# Patient Record
Sex: Female | Born: 1990 | Race: Black or African American | Hispanic: No | Marital: Single | Smoking: Current some day smoker
Health system: Southern US, Community
[De-identification: ages and names within clinical notes are randomized; demographics above are authoritative.]

## PROBLEM LIST (undated history)

## (undated) DIAGNOSIS — A749 Chlamydial infection, unspecified: Secondary | ICD-10-CM

## (undated) DIAGNOSIS — O139 Gestational [pregnancy-induced] hypertension without significant proteinuria, unspecified trimester: Secondary | ICD-10-CM

## (undated) DIAGNOSIS — B379 Candidiasis, unspecified: Secondary | ICD-10-CM

## (undated) DIAGNOSIS — O24419 Gestational diabetes mellitus in pregnancy, unspecified control: Secondary | ICD-10-CM

## (undated) DIAGNOSIS — R87629 Unspecified abnormal cytological findings in specimens from vagina: Secondary | ICD-10-CM

## (undated) HISTORY — PX: WISDOM TOOTH EXTRACTION: SHX21

## (undated) HISTORY — DX: Gestational (pregnancy-induced) hypertension without significant proteinuria, unspecified trimester: O13.9

## (undated) HISTORY — PX: PILONIDAL CYST EXCISION: SHX744

## (undated) HISTORY — DX: Unspecified abnormal cytological findings in specimens from vagina: R87.629

---
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
## 2010-03-08 ENCOUNTER — Emergency Department (HOSPITAL_COMMUNITY): Admission: EM | Admit: 2010-03-08 | Discharge: 2009-12-28 | Payer: Self-pay | Admitting: Emergency Medicine

## 2011-04-19 ENCOUNTER — Ambulatory Visit (INDEPENDENT_AMBULATORY_CARE_PROVIDER_SITE_OTHER)

## 2011-04-19 DIAGNOSIS — R52 Pain, unspecified: Secondary | ICD-10-CM

## 2011-04-19 DIAGNOSIS — L03317 Cellulitis of buttock: Secondary | ICD-10-CM

## 2011-04-21 ENCOUNTER — Ambulatory Visit (INDEPENDENT_AMBULATORY_CARE_PROVIDER_SITE_OTHER)

## 2011-04-21 DIAGNOSIS — N920 Excessive and frequent menstruation with regular cycle: Secondary | ICD-10-CM

## 2011-04-21 DIAGNOSIS — Z309 Encounter for contraceptive management, unspecified: Secondary | ICD-10-CM

## 2011-05-14 ENCOUNTER — Ambulatory Visit

## 2013-01-27 ENCOUNTER — Emergency Department (HOSPITAL_COMMUNITY)
Admission: EM | Admit: 2013-01-27 | Discharge: 2013-01-27 | Disposition: A | Discharge status: Home / Self Care | Attending: Emergency Medicine | Admitting: Emergency Medicine

## 2013-01-27 ENCOUNTER — Encounter (HOSPITAL_COMMUNITY): Payer: Self-pay | Admitting: Emergency Medicine

## 2013-01-27 DIAGNOSIS — F17200 Nicotine dependence, unspecified, uncomplicated: Secondary | ICD-10-CM | POA: Insufficient documentation

## 2013-01-27 DIAGNOSIS — J3489 Other specified disorders of nose and nasal sinuses: Secondary | ICD-10-CM | POA: Insufficient documentation

## 2013-01-27 DIAGNOSIS — J069 Acute upper respiratory infection, unspecified: Secondary | ICD-10-CM | POA: Insufficient documentation

## 2013-01-27 DIAGNOSIS — R0982 Postnasal drip: Secondary | ICD-10-CM | POA: Insufficient documentation

## 2013-01-27 DIAGNOSIS — B9789 Other viral agents as the cause of diseases classified elsewhere: Secondary | ICD-10-CM

## 2013-01-27 DIAGNOSIS — R630 Anorexia: Secondary | ICD-10-CM | POA: Insufficient documentation

## 2013-01-27 NOTE — ED Provider Notes (Signed)
Medical screening examination/treatment/procedure(s) were performed by non-physician practitioner and as supervising physician I was immediately available for consultation/collaboration.    Celene Kras, MD 01/27/13 [phone]

## 2013-01-27 NOTE — ED Notes (Addendum)
Pt c/o "having a cold," cough, nasal congestions and "a cloudy head" x 4 days. Denies pain.  Pt sts all symptoms have resolved except "cloudy head."

## 2013-01-27 NOTE — ED Provider Notes (Signed)
CSN: [id number]     Arrival date & time 01/27/13  1009 History   First MD Initiated Contact with Patient 01/27/13 1016     No chief complaint on file.  (Consider location/radiation/quality/duration/timing/severity/associated sxs/prior Treatment) HPI Patient presents with 5 days of cold symptoms.  Reports nasal congestion that has cleared, dry cough, post-nasal drip, and "fuzzy headedness."  Has been taking robitussin and Nyquil with some relief. Her course is overall improving. Denies headache, fever, chills, SOB, CP, difficulty swallowing.  Has been drinking plenty of fluids and resting but not eating much due to lack of appetite.  Denies abdominal pain, N/V/D.  Possible sick contact at recent football game who spit on her neck.   No past medical history on file. No past surgical history on file. No family history on file. History  Substance Use Topics  . Smoking status: Not on file  . Smokeless tobacco: Not on file  . Alcohol Use: Not on file   OB History   No data available     Review of Systems  Constitutional: Negative for fever and chills.  HENT: Positive for rhinorrhea. Negative for congestion, sore throat and trouble swallowing.   Respiratory: Positive for cough. Negative for shortness of breath.   Cardiovascular: Negative for chest pain.  Gastrointestinal: Negative for nausea, vomiting, abdominal pain and diarrhea.  Genitourinary: Negative for dysuria, urgency, frequency, vaginal bleeding and vaginal discharge.    Allergies  Review of patient's allergies indicates not on file.  Home Medications   Current Outpatient Rx  Name  Route  Sig  Dispense  Refill  . DM-Doxylamine-Acetaminophen (NYQUIL COLD & FLU PO)   Oral   Take 40 mLs by mouth daily.         Marland Kitchen guaifenesin (ROBITUSSIN) 100 MG/5ML syrup   Oral   Take 200 mg by mouth 3 (three) times daily as needed for cough.          BP 123/68  Pulse 74  Temp(Src) 99 F (37.2 C) (Oral)  Resp 20  SpO2  100% Physical Exam  Nursing note and vitals reviewed. Constitutional: She appears well-developed and well-nourished. No distress.  HENT:  Head: Atraumatic.  Nose: Right sinus exhibits no maxillary sinus tenderness and no frontal sinus tenderness. Left sinus exhibits no maxillary sinus tenderness and no frontal sinus tenderness.  Mouth/Throat: Oropharynx is clear and moist. No oropharyngeal exudate.  Eyes: Conjunctivae are normal.  Neck: Trachea normal, normal range of motion, full passive range of motion without pain and phonation normal. Neck supple.  Cardiovascular: Normal rate and regular rhythm.   Pulmonary/Chest: Effort normal and breath sounds normal. No stridor. No respiratory distress. She has no wheezes. She has no rales.  Abdominal: Soft. She exhibits no distension. There is no tenderness. There is no rebound and no guarding.  Lymphadenopathy:    She has no cervical adenopathy.  Neurological: She is alert.  Skin: She is not diaphoretic.    ED Course  Procedures (including critical care time) Labs Review Labs Reviewed - No data to display Imaging Review No results found.  EKG Interpretation   None       MDM   1. Viral respiratory illness     Patient with constellation of respiratory symptoms over 5 days that is overall improving, taking OTC medications.  Afebrile, nontoxic, no meningeal signs.  Exam unremarkable.  Likely viral etiology. Pt not eating much, likely contributing to the "fuzzy headedness"  Discussed findings, treatment, and follow up  with patient.  Pt  given return precautions.  Pt verbalizes understanding and agrees with plan.       Trixie Dredge, PA-C 01/27/13 1103

## 2013-05-20 ENCOUNTER — Encounter (HOSPITAL_COMMUNITY): Payer: Self-pay | Admitting: Emergency Medicine

## 2013-05-20 ENCOUNTER — Emergency Department (HOSPITAL_COMMUNITY)
Admission: EM | Admit: 2013-05-20 | Discharge: 2013-05-20 | Disposition: A | Discharge status: Home / Self Care | Attending: Emergency Medicine | Admitting: Emergency Medicine

## 2013-05-20 DIAGNOSIS — L03317 Cellulitis of buttock: Principal | ICD-10-CM

## 2013-05-20 DIAGNOSIS — F17200 Nicotine dependence, unspecified, uncomplicated: Secondary | ICD-10-CM | POA: Insufficient documentation

## 2013-05-20 DIAGNOSIS — L0231 Cutaneous abscess of buttock: Secondary | ICD-10-CM | POA: Insufficient documentation

## 2013-05-20 MED ORDER — AMOXICILLIN-POT CLAVULANATE 875-125 MG PO TABS: 1.0000 | ORAL_TABLET | Freq: Two times a day (BID) | ORAL | Status: DC

## 2013-05-20 MED ORDER — HYDROCODONE-ACETAMINOPHEN 5-325 MG PO TABS: 1.0000 | ORAL_TABLET | Freq: Four times a day (QID) | ORAL | Status: DC | PRN

## 2013-05-20 NOTE — ED Provider Notes (Signed)
CSN: [id number]     Arrival date & time 05/20/13  1632 History   First MD Initiated Contact with Patient 05/20/13 1653     Chief Complaint  Patient presents with  . Abscess     (Consider location/radiation/quality/duration/timing/severity/associated sxs/prior Treatment) Patient is a 23 y.o. female presenting with abscess. The history is provided by the patient.  Abscess Location:  Ano-genital Ano-genital abscess location:  L buttock Size:  2 cm Abscess quality: painful and warmth   Abscess quality: not draining, no fluctuance, no induration, no itching, no redness and not weeping   Red streaking: no   Progression:  Worsening Pain details:    Quality:  Sharp   Severity:  Moderate   Timing:  Constant Chronicity:  Recurrent Context: not diabetes and not immunosuppression   Relieved by:  Nothing Worsened by:  Nothing tried Associated symptoms: no fever and no vomiting     History reviewed. No pertinent past medical history. Past Surgical History  Procedure Laterality Date  . Pilonidal cyst excision     No family history on file. History  Substance Use Topics  . Smoking status: Current Every Day Smoker -- 0.25 packs/day    Types: Cigarettes  . Smokeless tobacco: Never Used  . Alcohol Use: Yes   OB History   Grav Para Term Preterm Abortions TAB SAB Ect Mult Living                 Review of Systems  Constitutional: Negative for fever.  Respiratory: Negative for cough and shortness of breath.   Cardiovascular: Negative for chest pain and leg swelling.  Gastrointestinal: Negative for vomiting and abdominal pain.  All other systems reviewed and are negative.      Allergies  Review of patient's allergies indicates no known allergies.  Home Medications   Current Outpatient Rx  Name  Route  Sig  Dispense  Refill  . acetaminophen (TYLENOL) 500 MG tablet   Oral   Take 1,000 mg by mouth every 6 (six) hours as needed for moderate pain.         Marland Kitchen. ibuprofen  (ADVIL,MOTRIN) 200 MG tablet   Oral   Take 200 mg by mouth every 6 (six) hours as needed for moderate pain.          BP 123/77  Pulse 83  Temp(Src) 98.7 F (37.1 C) (Oral)  Resp 20  SpO2 100%  LMP 05/08/2013 Physical Exam  Nursing note and vitals reviewed. Constitutional: She is oriented to person, place, and time. She appears well-developed and well-nourished. No distress.  HENT:  Head: Normocephalic and atraumatic.  Eyes: EOM are normal. Pupils are equal, round, and reactive to light.  Neck: Normal range of motion. Neck supple.  Cardiovascular: Normal rate and regular rhythm.  Exam reveals no friction rub.   No murmur heard. Pulmonary/Chest: Effort normal and breath sounds normal. No respiratory distress. She has no wheezes. She has no rales.  Abdominal: Soft. She exhibits no distension. There is no tenderness. There is no rebound.  Musculoskeletal: Normal range of motion. She exhibits no edema.  Neurological: She is alert and oriented to person, place, and time.  Skin: No abrasion, no burn and no rash noted. She is not diaphoretic.       ED Course  INCISION AND DRAINAGE Date/Time: 05/20/2013 6:00 PM Performed by: Dagmar HaitWALDEN, WILLIAM Deliliah Spranger Authorized by: Dagmar HaitWALDEN, WILLIAM Mearle Drew Consent: Verbal consent obtained. Type: abscess Body area: lower extremity Location details: left buttock Anesthesia: local infiltration Local anesthetic: lidocaine  2% with epinephrine Anesthetic total: 6 ml Patient sedated: no Risk factor: underlying major vessel Scalpel size: 11 Incision type: single straight Complexity: simple Drainage: purulent Drainage amount: copious Wound treatment: drain placed Packing material: 1/2 in iodoform gauze Patient tolerance: Patient tolerated the procedure well with no immediate complications.   (including critical care time) Labs Review Labs Reviewed - No data to display Imaging Review No results found.  EKG Interpretation   None       MDM    Final diagnoses:  Left buttock abscess    87F here with buttock abscess. Hx of pilonidal cyst excision. No pilonidal today, small abscess left of midline on superior left buttock. No surrounding cellulitis. I&D as above. Surgery referral given.  Will place on antibiotics due to large amount of pus expressed and much induration once examined with some local anesthesia. Stable for discharge.  Dagmar Hait, MD 05/20/13 [phone]

## 2013-05-20 NOTE — Discharge Instructions (Signed)
Abscess An abscess is an infected area that contains a collection of pus and debris.It can occur in almost any part of the body. An abscess is also known as a furuncle or boil. CAUSES  An abscess occurs when tissue gets infected. This can occur from blockage of oil or sweat glands, infection of hair follicles, or a minor injury to the skin. As the body tries to fight the infection, pus collects in the area and creates pressure under the skin. This pressure causes pain. People with weakened immune systems have difficulty fighting infections and get certain abscesses more often.  SYMPTOMS Usually an abscess develops on the skin and becomes a painful mass that is red, warm, and tender. If the abscess forms under the skin, you may feel a moveable soft area under the skin. Some abscesses break open (rupture) on their own, but most will continue to get worse without care. The infection can spread deeper into the body and eventually into the bloodstream, causing you to feel ill.  DIAGNOSIS  Your caregiver will take your medical history and perform a physical exam. A sample of fluid may also be taken from the abscess to determine what is causing your infection. TREATMENT  Your caregiver may prescribe antibiotic medicines to fight the infection. However, taking antibiotics alone usually does not cure an abscess. Your caregiver may need to make a small cut (incision) in the abscess to drain the pus. In some cases, gauze is packed into the abscess to reduce pain and to continue draining the area. HOME CARE INSTRUCTIONS   Only take over-the-counter or prescription medicines for pain, discomfort, or fever as directed by your caregiver.  If you were prescribed antibiotics, take them as directed. Finish them even if you start to feel better.  If gauze is used, follow your caregiver's directions for changing the gauze.  To avoid spreading the infection:  Keep your draining abscess covered with a  bandage.  Wash your hands well.  Do not share personal care items, towels, or whirlpools with others.  Avoid skin contact with others.  Keep your skin and clothes clean around the abscess.  Keep all follow-up appointments as directed by your caregiver. SEEK MEDICAL CARE IF:   You have increased pain, swelling, redness, fluid drainage, or bleeding.  You have muscle aches, chills, or a general ill feeling.  You have a fever. MAKE SURE YOU:   Understand these instructions.  Will watch your condition.  Will get help right away if you are not doing well or get worse. Document Released: 12/26/2004 Document Revised: 09/17/2011 Document Reviewed: 05/31/2011 [employer] Patient Information 2014 [employer].  

## 2013-05-20 NOTE — ED Notes (Signed)
Pt c/o abscess on her tailbone that came up on Monday. Pt states that she has re-occurring abscess in that seam spot for several years now. Pt requesting referral to surgeon to get the core surgically removed to prevent them from coming back.

## 2013-05-22 ENCOUNTER — Encounter (HOSPITAL_COMMUNITY): Payer: Self-pay | Admitting: Emergency Medicine

## 2013-05-22 ENCOUNTER — Emergency Department (HOSPITAL_COMMUNITY)
Admission: EM | Admit: 2013-05-22 | Discharge: 2013-05-22 | Disposition: A | Discharge status: Home / Self Care | Attending: Emergency Medicine | Admitting: Emergency Medicine

## 2013-05-22 DIAGNOSIS — F17200 Nicotine dependence, unspecified, uncomplicated: Secondary | ICD-10-CM | POA: Insufficient documentation

## 2013-05-22 DIAGNOSIS — Z792 Long term (current) use of antibiotics: Secondary | ICD-10-CM | POA: Insufficient documentation

## 2013-05-22 DIAGNOSIS — L03317 Cellulitis of buttock: Principal | ICD-10-CM

## 2013-05-22 DIAGNOSIS — L0231 Cutaneous abscess of buttock: Secondary | ICD-10-CM | POA: Insufficient documentation

## 2013-05-22 MED ORDER — OXYCODONE-ACETAMINOPHEN 5-325 MG PO TABS: 1.0000 | ORAL_TABLET | Freq: Four times a day (QID) | ORAL | Status: DC | PRN

## 2013-05-22 NOTE — Discharge Instructions (Signed)
Return here as needed. °

## 2013-05-22 NOTE — ED Notes (Signed)
Per pt, had abscess drained on Thurs-here for check

## 2013-05-22 NOTE — ED Provider Notes (Signed)
CSN: [id number]     Arrival date & time 05/22/13  1544 History  This chart was scribed for non-physician practitioner, Ebbie Ridgehris Caleyah Jr, PA-C,working with Hilario Quarryanielle S Ray, MD, by Karle PlumberJennifer Tensley, ED Scribe.  This patient was seen in room WTR5/WTR5 and the patient's care was started at 4:34 PM.  Chief Complaint  Patient presents with  . Wound Check   The history is provided by the patient. No language interpreter was used.   HPI Comments:  Michelle Taylor is a 23 y.o. female who presents to the Emergency Department needing a wound check secondary to having an left buttock abscess drained here two days ago. Pt states she had some pain and drainage since. She denies fever.   History reviewed. No pertinent past medical history. Past Surgical History  Procedure Laterality Date  . Pilonidal cyst excision     No family history on file. History  Substance Use Topics  . Smoking status: Current Every Day Smoker -- 0.25 packs/day    Types: Cigarettes  . Smokeless tobacco: Never Used  . Alcohol Use: Yes   OB History   Grav Para Term Preterm Abortions TAB SAB Ect Mult Living                 Review of Systems A complete 10 system review of systems was obtained and all systems are negative except as noted in the HPI and PMH.   Allergies  Review of patient's allergies indicates no known allergies.  Home Medications   Current Outpatient Rx  Name  Route  Sig  Dispense  Refill  . amoxicillin-clavulanate (AUGMENTIN) 875-125 MG per tablet   Oral   Take 1 tablet by mouth 2 (two) times daily. One po bid x 7 days   14 tablet   0   . HYDROcodone-acetaminophen (NORCO) 5-325 MG per tablet   Oral   Take 1 tablet by mouth every 6 (six) hours as needed for moderate pain.   10 tablet   0    Triage Vitals: BP 114/66  Pulse 88  Temp(Src) 98.2 F (36.8 C) (Oral)  Resp 16  SpO2 100%  LMP 05/08/2013 Physical Exam  Nursing note and vitals reviewed. Constitutional: She is oriented to person,  place, and time. She appears well-developed and well-nourished.  HENT:  Head: Normocephalic and atraumatic.  Eyes: EOM are normal.  Neck: Normal range of motion.  Cardiovascular: Normal rate.   Pulmonary/Chest: Effort normal.  Musculoskeletal: Normal range of motion.  Neurological: She is alert and oriented to person, place, and time.  Skin: Skin is warm and dry.  Abscess to upper left buttock. Still draining pus.  Psychiatric: She has a normal mood and affect. Her behavior is normal.    ED Course  Procedures (including critical care time) DIAGNOSTIC STUDIES: Oxygen Saturation is 100% on RA, normal by my interpretation.   COORDINATION OF CARE: 4:36 PM- Will give pt medication for pain and redress wound. Pt verbalizes understanding and agrees to plan.    I personally performed the services described in this documentation, which was scribed in my presence. The recorded information has been reviewed and is accurate.    Carlyle DollyChristopher W Shadd Dunstan, PA-C 05/22/13 1650

## 2013-05-23 NOTE — ED Provider Notes (Signed)
History/physical exam/procedure(s) were performed by non-physician practitioner and as supervising physician I was immediately available for consultation/collaboration. I have reviewed all notes and am in agreement with care and plan.   Greggory Safranek S Markies Mowatt, MD 05/23/13 0020 

## 2013-05-27 ENCOUNTER — Telehealth (HOSPITAL_COMMUNITY): Payer: Self-pay | Admitting: Emergency Medicine

## 2013-05-27 NOTE — ED Notes (Signed)
Pharmacy called to verify Rx for Diflucan.

## 2013-06-21 ENCOUNTER — Ambulatory Visit (INDEPENDENT_AMBULATORY_CARE_PROVIDER_SITE_OTHER): Admitting: General Surgery

## 2013-06-21 ENCOUNTER — Encounter (INDEPENDENT_AMBULATORY_CARE_PROVIDER_SITE_OTHER): Payer: Self-pay | Admitting: General Surgery

## 2013-06-21 VITALS — BP 124/80 | HR 76 | Temp 98.2°F | Resp 16 | Ht 60.0 in | Wt 157.0 lb

## 2013-06-21 DIAGNOSIS — L0591 Pilonidal cyst without abscess: Secondary | ICD-10-CM

## 2013-06-21 NOTE — Progress Notes (Signed)
Patient ID: Michelle Taylor, female   DOB: 11-Apr-1990, 23 y.o.   MRN: [id number]  Chief Complaint  Patient presents with  . Cyst    HPI Michelle KannerBrittany Taylor is a 23 y.o. female.  The patient is a 23 year old female arrest today secondary to a pilonidal cyst that was incised and drained. She's been doing with this proximally for 8 years on a yearly basis.   The patient has been on antibiotics, Augmentin, since incision and drainage. Patient states the pain has gotten better since the initial incision and drainage. She's had no fevers. HPI  History reviewed. No pertinent past medical history.  Past Surgical History  Procedure Laterality Date  . Pilonidal cyst excision      History reviewed. No pertinent family history.  Social History History  Substance Use Topics  . Smoking status: Current Every Day Smoker -- 0.25 packs/day    Types: Cigarettes  . Smokeless tobacco: Never Used  . Alcohol Use: Yes    No Known Allergies  Current Outpatient Prescriptions  Medication Sig Dispense Refill  . HYDROcodone-acetaminophen (NORCO) 5-325 MG per tablet Take 1 tablet by mouth every 6 (six) hours as needed for moderate pain.  10 tablet  0  . oxyCODONE-acetaminophen (PERCOCET/ROXICET) 5-325 MG per tablet Take 1 tablet by mouth every 6 (six) hours as needed for severe pain.  15 tablet  0   No current facility-administered medications for this visit.    Review of Systems Review of Systems  Constitutional: Negative.   HENT: Negative.   Respiratory: Negative.   Cardiovascular: Negative.   Gastrointestinal: Negative.   Neurological: Negative.   All other systems reviewed and are negative.    Blood pressure 124/80, pulse 76, temperature 98.2 F (36.8 C), temperature source Oral, resp. rate 16, height 5' (1.524 m), weight 157 lb (71.215 kg).  Physical Exam Physical Exam  Constitutional: She is oriented to person, place, and time. She appears well-developed and well-nourished.  HENT:  Head:  Normocephalic and atraumatic.  Eyes: Conjunctivae and EOM are normal. Pupils are equal, round, and reactive to light.  Neck: Normal range of motion. Neck supple.  Cardiovascular: Normal rate, regular rhythm and normal heart sounds.   Pulmonary/Chest: Effort normal and breath sounds normal.  Abdominal: Soft. Bowel sounds are normal. She exhibits no distension and no mass. There is no tenderness. There is no rebound and no guarding.  Musculoskeletal: Normal range of motion.  Neurological: She is alert and oriented to person, place, and time.  Skin: Skin is warm and dry.     Psychiatric: She has a normal mood and affect.    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 We will proceed to the operating room for excision of a pilonidal cyst. 2. I discussed with the patient the risks and benefits of the procedure to include but not limited to infection, bleeding, damage to structures, possible wound healing complications, possible need for revision. Patient voiced understanding and wishes to proceed.        Marigene Ehlersamirez Jr., Michelle Taylor 06/21/2013, 4:06 PM

## 2017-07-11 DIAGNOSIS — N611 Abscess of the breast and nipple: Secondary | ICD-10-CM | POA: Diagnosis not present

## 2017-07-14 DIAGNOSIS — N611 Abscess of the breast and nipple: Secondary | ICD-10-CM | POA: Diagnosis not present

## 2017-07-15 DIAGNOSIS — N611 Abscess of the breast and nipple: Secondary | ICD-10-CM | POA: Diagnosis not present

## 2017-09-10 ENCOUNTER — Ambulatory Visit (INDEPENDENT_AMBULATORY_CARE_PROVIDER_SITE_OTHER): Payer: BLUE CROSS/BLUE SHIELD | Admitting: *Deleted

## 2017-09-10 DIAGNOSIS — N926 Irregular menstruation, unspecified: Secondary | ICD-10-CM

## 2017-09-10 DIAGNOSIS — Z3201 Encounter for pregnancy test, result positive: Secondary | ICD-10-CM | POA: Diagnosis not present

## 2017-09-10 LAB — POCT URINE PREGNANCY: Preg Test, Ur: POSITIVE — AB

## 2017-09-10 NOTE — Progress Notes (Signed)
I have reviewed the chart and agree with nursing staff's documentation of this patient's encounter.  Roe CoombsRachelle A Bexton Haak, CNM 09/10/2017 11:49 AM

## 2017-09-10 NOTE — Progress Notes (Signed)
[name] presents today for UPT. She has no unusual complaints. LMP: 08/04/17    OBJECTIVE: Appears well, in no apparent distress.  OB History    Gravida  1   Para      Term      Preterm      AB      Living        SAB      TAB      Ectopic      Multiple      Live Births             Home UPT Result: Positive  In-Office UPT result: Positive I have reviewed the patient's medical, obstetrical, social, and family histories, and medications.   ASSESSMENT: Positive pregnancy test  PLAN: Prenatal care to be completed at: [facility] PNV samples given today. 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  Pt advised to apply for Medicaid soon as it may take up to 30 days to approve.

## 2017-09-12 ENCOUNTER — Encounter: Payer: Self-pay | Admitting: *Deleted

## 2017-10-20 ENCOUNTER — Encounter: Payer: Self-pay | Admitting: Advanced Practice Midwife

## 2017-10-20 ENCOUNTER — Ambulatory Visit (INDEPENDENT_AMBULATORY_CARE_PROVIDER_SITE_OTHER): Payer: BLUE CROSS/BLUE SHIELD | Admitting: Advanced Practice Midwife

## 2017-10-20 VITALS — BP 123/78 | HR 89 | Wt 166.0 lb

## 2017-10-20 DIAGNOSIS — Z3481 Encounter for supervision of other normal pregnancy, first trimester: Secondary | ICD-10-CM | POA: Diagnosis not present

## 2017-10-20 DIAGNOSIS — Z3401 Encounter for supervision of normal first pregnancy, first trimester: Secondary | ICD-10-CM | POA: Diagnosis not present

## 2017-10-20 DIAGNOSIS — E559 Vitamin D deficiency, unspecified: Secondary | ICD-10-CM | POA: Diagnosis not present

## 2017-10-20 MED ORDER — VITAFOL ULTRA 29-0.6-0.4-200 MG PO CAPS: 1.0000 | ORAL_CAPSULE | Freq: Every day | ORAL | 11 refills | Status: DC

## 2017-10-20 NOTE — Progress Notes (Signed)
   PRENATAL VISIT NOTE  Subjective:  Michelle Taylor is a 27 y.o. G1P0 at 5112w0d being seen today for initial prenatal visit.  She is currently monitored for the following issues for this low-risk pregnancy and has Encounter for supervision of normal first pregnancy in first trimester on their problem list.  Patient reports no complaints.  Contractions: Not present. Vag. Bleeding: None.   . Denies leaking of fluid.   The following portions of the patient's history were reviewed and updated as appropriate: allergies, current medications, past family history, past medical history, past social history, past surgical history and problem list. Problem list updated.  Objective:   Vitals:   10/20/17 1014  BP: 123/78  Pulse: 89  Weight: 166 lb (75.3 kg)    Fetal Status: Fetal Heart Rate (bpm): 166         General:  Alert, oriented and cooperative. Patient is in no acute distress.  Skin: Skin is warm and dry. No rash noted.   Cardiovascular: Normal heart rate noted  Respiratory: Normal respiratory effort, no problems with respiration noted  Abdomen: Soft, gravid, appropriate for gestational age.  Pain/Pressure: Absent     Pelvic: Cervical exam deferred Pap 2 years ago, consider Pap postpartum.        Extremities: Normal range of motion.     Mental Status: Normal mood and affect. Normal behavior. Normal judgment and thought content.   Assessment and Plan:  Pregnancy: G1P0 at 5512w0d  1. Encounter for supervision of normal first pregnancy in first trimester --Anticipatory guidance about next visits/weeks of pregnancy given. - Cytology - PAP - Hemoglobinopathy evaluation - VITAMIN D 25 Hydroxy (Vit-D Deficiency, Fractures) - Culture, OB Urine - Obstetric Panel, Including HIV - Hemoglobin A1c --Discussed genetic screening as optional testing with pt.  Discussed risks of false positives and anxiety awaiting test results.  Discussed benefits of testing including early knowledge of  problems/anomalies and time for termination if desired. Discussed alternatives include diagnostic testing including amniocentesis and NIPS or choosing to decline genetic screening by blood work.  Pt aware that anatomy US is form of genetic screening with lower accuracy in detecting trisomies than blood work.  Pt chooses genetic screening today. - Genetic Screening  Preterm labor symptoms and general obstetric precautions including but not limited to vaginal bleeding, contractions, leaking of fluid and fetal movement were reviewed in detail with the patient. Please refer to After Visit Summary for other counseling recommendations.  Return in about 1 month (around 11/17/2017).  Future Appointments  Date Time Provider Department Center  11/17/2017  9:30 AM Calvert CantorWeinhold, Samantha C, CNM [facility] None    Sharen CounterLisa Leftwich-Kirby, [place]

## 2017-10-20 NOTE — Patient Instructions (Signed)

## 2017-10-23 LAB — OBSTETRIC PANEL, INCLUDING HIV
Antibody Screen: NEGATIVE
Basophils Absolute: 0 x10E3/uL (ref 0.0–0.2)
Basos: 0 %
EOS (ABSOLUTE): 0 x10E3/uL (ref 0.0–0.4)
Eos: 0 %
HIV Screen 4th Generation wRfx: NONREACTIVE
Hematocrit: 38.3 % (ref 34.0–46.6)
Hemoglobin: 12.3 g/dL (ref 11.1–15.9)
Hepatitis B Surface Ag: NEGATIVE
Immature Grans (Abs): 0 x10E3/uL (ref 0.0–0.1)
Immature Granulocytes: 0 %
Lymphocytes Absolute: 1.8 x10E3/uL (ref 0.7–3.1)
Lymphs: 30 %
MCH: 29.6 pg (ref 26.6–33.0)
MCHC: 32.1 g/dL (ref 31.5–35.7)
MCV: 92 fL (ref 79–97)
Monocytes Absolute: 0.5 x10E3/uL (ref 0.1–0.9)
Monocytes: 8 %
Neutrophils Absolute: 3.8 x10E3/uL (ref 1.4–7.0)
Neutrophils: 62 %
Platelets: 337 x10E3/uL (ref 150–450)
RBC: 4.16 x10E6/uL (ref 3.77–5.28)
RDW: 13.6 % (ref 12.3–15.4)
RPR Ser Ql: NONREACTIVE
Rh Factor: POSITIVE
Rubella Antibodies, IGG: 23.4 {index}
WBC: 6.1 x10E3/uL (ref 3.4–10.8)

## 2017-10-23 LAB — HEMOGLOBINOPATHY EVALUATION
HGB C: 0 %
HGB S: 0 %
HGB VARIANT: 0 %
Hemoglobin A2 Quantitation: 2.5 % (ref 1.8–3.2)
Hemoglobin F Quantitation: 0 % (ref 0.0–2.0)
Hgb A: 97.5 % (ref 96.4–98.8)

## 2017-10-23 LAB — CULTURE, OB URINE

## 2017-10-23 LAB — HEMOGLOBIN A1C
ESTIMATED AVERAGE GLUCOSE: 117 mg/dL
Hgb A1c MFr Bld: 5.7 % — ABNORMAL HIGH (ref 4.8–5.6)

## 2017-10-23 LAB — URINE CULTURE, OB REFLEX

## 2017-10-23 LAB — VITAMIN D 25 HYDROXY (VIT D DEFICIENCY, FRACTURES): Vit D, 25-Hydroxy: 28.9 ng/mL — ABNORMAL LOW (ref 30.0–100.0)

## 2017-10-27 ENCOUNTER — Telehealth: Payer: Self-pay

## 2017-10-27 NOTE — Telephone Encounter (Signed)
Patient called in to see if panorama results were in, and to discuss hip pain. Pt denies any pelvic pressure and spotting.

## 2017-11-17 ENCOUNTER — Encounter: Payer: Self-pay | Admitting: Advanced Practice Midwife

## 2017-11-17 ENCOUNTER — Ambulatory Visit (INDEPENDENT_AMBULATORY_CARE_PROVIDER_SITE_OTHER): Payer: Medicaid Other | Admitting: Advanced Practice Midwife

## 2017-11-17 VITALS — BP 122/74 | HR 95 | Wt 167.4 lb

## 2017-11-17 DIAGNOSIS — Z3402 Encounter for supervision of normal first pregnancy, second trimester: Secondary | ICD-10-CM

## 2017-11-17 DIAGNOSIS — R7303 Prediabetes: Secondary | ICD-10-CM | POA: Insufficient documentation

## 2017-11-17 DIAGNOSIS — N949 Unspecified condition associated with female genital organs and menstrual cycle: Secondary | ICD-10-CM

## 2017-11-17 DIAGNOSIS — Z3401 Encounter for supervision of normal first pregnancy, first trimester: Secondary | ICD-10-CM

## 2017-11-17 NOTE — Patient Instructions (Signed)
Second Trimester of Pregnancy The second trimester is from week 13 through week 28, month 4 through 6. This is often the time in pregnancy that you feel your best. Often times, morning sickness has lessened or quit. You may have more energy, and you may get hungry more often. Your unborn baby (fetus) is growing rapidly. At the end of the sixth month, he or she is about 9 inches long and weighs about 1 pounds. You will likely feel the baby move (quickening) between 18 and 20 weeks of pregnancy. Follow these instructions at home:  Avoid all smoking, herbs, and alcohol. Avoid drugs not approved by your doctor.  Do not use any tobacco products, including cigarettes, chewing tobacco, and electronic cigarettes. If you need help quitting, ask your doctor. You may get counseling or other support to help you quit.  Only take medicine as told by your doctor. Some medicines are safe and some are not during pregnancy.  Exercise only as told by your doctor. Stop exercising if you start having cramps.  Eat regular, healthy meals.  Wear a good support bra if your breasts are tender.  Do not use hot tubs, steam rooms, or saunas.  Wear your seat belt when driving.  Avoid raw meat, uncooked cheese, and liter boxes and soil used by cats.  Take your prenatal vitamins.  Take 1500-2000 milligrams of calcium daily starting at the 20th week of pregnancy until you deliver your baby.  Try taking medicine that helps you poop (stool softener) as needed, and if your doctor approves. Eat more fiber by eating fresh fruit, vegetables, and whole grains. Drink enough fluids to keep your pee (urine) clear or pale yellow.  Take warm water baths (sitz baths) to soothe pain or discomfort caused by hemorrhoids. Use hemorrhoid cream if your doctor approves.  If you have puffy, bulging veins (varicose veins), wear support hose. Raise (elevate) your feet for 15 minutes, 3-4 times a day. Limit salt in your diet.  Avoid heavy  lifting, wear low heals, and sit up straight.  Rest with your legs raised if you have leg cramps or low back pain.  Visit your dentist if you have not gone during your pregnancy. Use a soft toothbrush to brush your teeth. Be gentle when you floss.  You can have sex (intercourse) unless your doctor tells you not to.  Go to your doctor visits. Get help if:  You feel dizzy.  You have mild cramps or pressure in your lower belly (abdomen).  You have a nagging pain in your belly area.  You continue to feel sick to your stomach (nauseous), throw up (vomit), or have watery poop (diarrhea).  You have bad smelling fluid coming from your vagina.  You have pain with peeing (urination). Get help right away if:  You have a fever.  You are leaking fluid from your vagina.  You have spotting or bleeding from your vagina.  You have severe belly cramping or pain.  You lose or gain weight rapidly.  You have trouble catching your breath and have chest pain.  You notice sudden or extreme puffiness (swelling) of your face, hands, ankles, feet, or legs.  You have not felt the baby move in over an hour.  You have severe headaches that do not go away with medicine.  You have vision changes. This information is not intended to replace advice given to you by your health care provider. Make sure you discuss any questions you have with your health care   provider. Document Released: 06/12/2009 Document Revised: 08/24/2015 Document Reviewed: 05/19/2012 Elsevier Interactive Patient Education  2017 Elsevier Inc.  

## 2017-11-17 NOTE — Progress Notes (Signed)
   PRENATAL VISIT NOTE  Subjective:  Carmie KannerBrittany Grunert is a 27 y.o. G1P0 at 115w0d being seen today for ongoing prenatal care.  She is currently monitored for the following issues for this low-risk pregnancy and has Encounter for supervision of normal first pregnancy in first trimester and Prediabetes on their problem list.  Patient reports no bleeding, no contractions, no cramping and no leaking.  Contractions: Not present. Vag. Bleeding: None.  Movement: Present. Denies leaking of fluid.   The following portions of the patient's history were reviewed and updated as appropriate: allergies, current medications, past family history, past medical history, past social history, past surgical history and problem list. Problem list updated.  Objective:   Vitals:   11/17/17 0952  BP: 122/74  Pulse: 95  Weight: 167 lb 6.4 oz (75.9 kg)    Fetal Status: Fetal Heart Rate (bpm): 156   Movement: Present     General:  Alert, oriented and cooperative. Patient is in no acute distress.  Skin: Skin is warm and dry. No rash noted.   Cardiovascular: Normal heart rate noted  Respiratory: Normal respiratory effort, no problems with respiration noted  Abdomen: Soft, gravid, appropriate for gestational age.  Pain/Pressure: Present     Pelvic: Cervical exam deferred        Extremities: Normal range of motion.  Edema: None  Mental Status: Normal mood and affect. Normal behavior. Normal judgment and thought content.   Assessment and Plan:  Pregnancy: G1P0 at 8327w0d  1. Encounter for supervision of normal first pregnancy in second trimester - Round ligament pain, discussed sleeping with pillow between knees, floating in pool - Continue routine care - AFP, Serum, Open Spina Bifida - US MFM OB COMP + 14 WK; Future  2. Prediabetes - Discussed dietary focus on protein, fresh vegetables, focus on PO hydration with water   Preterm labor symptoms and general obstetric precautions including but not limited to  vaginal bleeding, contractions, leaking of fluid and fetal movement were reviewed in detail with the patient. Please refer to After Visit Summary for other counseling recommendations.  Return in about 5 weeks (around 12/22/2017).  No future appointments.  Calvert CantorSamantha C Weinhold, CNM

## 2017-11-17 NOTE — Progress Notes (Signed)
Pt presents for ROB c/o bilateral hip pain. AFP due today.

## 2017-11-19 LAB — AFP, SERUM, OPEN SPINA BIFIDA
AFP MoM: 0.58
AFP VALUE AFPOSL: 16.8 ng/mL
GEST. AGE ON COLLECTION DATE: 15 wk
MATERNAL AGE AT EDD: 27.3 a
OSBR RISK 1 IN: 10000
Test Results:: NEGATIVE
Weight: 168 [lb_av]

## 2017-12-15 ENCOUNTER — Encounter (HOSPITAL_COMMUNITY): Payer: Self-pay

## 2017-12-22 ENCOUNTER — Ambulatory Visit (HOSPITAL_COMMUNITY)
Admission: RE | Admit: 2017-12-22 | Discharge: 2017-12-22 | Disposition: A | Discharge status: Home / Self Care | Payer: Medicaid Other | Source: Ambulatory Visit | Attending: Advanced Practice Midwife | Admitting: Advanced Practice Midwife

## 2017-12-22 ENCOUNTER — Other Ambulatory Visit (HOSPITAL_COMMUNITY): Payer: Self-pay | Admitting: *Deleted

## 2017-12-22 ENCOUNTER — Ambulatory Visit (INDEPENDENT_AMBULATORY_CARE_PROVIDER_SITE_OTHER): Payer: Medicaid Other

## 2017-12-22 DIAGNOSIS — Z3402 Encounter for supervision of normal first pregnancy, second trimester: Secondary | ICD-10-CM | POA: Diagnosis present

## 2017-12-22 DIAGNOSIS — Z3A20 20 weeks gestation of pregnancy: Secondary | ICD-10-CM | POA: Diagnosis not present

## 2017-12-22 DIAGNOSIS — Z363 Encounter for antenatal screening for malformations: Secondary | ICD-10-CM | POA: Diagnosis not present

## 2017-12-22 DIAGNOSIS — Z3401 Encounter for supervision of normal first pregnancy, first trimester: Secondary | ICD-10-CM

## 2017-12-22 DIAGNOSIS — Z362 Encounter for other antenatal screening follow-up: Secondary | ICD-10-CM

## 2017-12-22 NOTE — Progress Notes (Signed)
Pt is here for ROB. Pt is G1P0 382w0d. Anatomy scan US is today.

## 2017-12-22 NOTE — Patient Instructions (Signed)

## 2017-12-22 NOTE — Progress Notes (Signed)
   PRENATAL VISIT NOTE  Subjective:  Michelle Taylor is a 27 y.o. G1P0 at [redacted]w[redacted]d being seen today for ongoing prenatal care.  She is currently monitored for the following issues for this low-risk pregnancy and has Encounter for supervision of normal first pregnancy in first trimester and Prediabetes on their problem list.  Patient reports no complaints.  Contractions: Not present. Vag. Bleeding: None.  Movement: Present. Denies leaking of fluid.   The following portions of the patient's history were reviewed and updated as appropriate: allergies, current medications, past family history, past medical history, past social history, past surgical history and problem list. Problem list updated.  Objective:   Vitals:   12/22/17 0859  BP: 131/86  Pulse: (!) 112  Weight: 172 lb 6.4 oz (78.2 kg)    Fetal Status: Fetal Heart Rate (bpm): seen on portable US Fundal Height: 20 cm Movement: Present     General:  Alert, oriented and cooperative. Patient is in no acute distress.  Skin: Skin is warm and dry. No rash noted.   Cardiovascular: Normal heart rate noted  Respiratory: Normal respiratory effort, no problems with respiration noted  Abdomen: Soft, gravid, appropriate for gestational age.  Pain/Pressure: Absent     Pelvic: Cervical exam deferred        Extremities: Normal range of motion.  Edema: None  Mental Status: Normal mood and affect. Normal behavior. Normal judgment and thought content.   Assessment and Plan:  Pregnancy: G1P0 at [redacted]w[redacted]d  1. Encounter for supervision of normal first pregnancy in first trimester - No complaints. Routine care. - FHT confirmed at 135 with bedside u/s - Anatomy u/s today  Preterm labor symptoms and general obstetric precautions including but not limited to vaginal bleeding, contractions, leaking of fluid and fetal movement were reviewed in detail with the patient. Please refer to After Visit Summary for other counseling recommendations.  Return in about  4 weeks (around 01/19/2018) for Return OB visit.  Future Appointments  Date Time Provider Department Center  12/22/2017 10:15 AM [facility] US 4 [facility]    Rolm BookbinderCaroline M Allis Quirarte, [place] 12/22/17 9:17 AM

## 2018-01-22 ENCOUNTER — Encounter: Payer: Self-pay | Admitting: Certified Nurse Midwife

## 2018-01-22 ENCOUNTER — Ambulatory Visit (INDEPENDENT_AMBULATORY_CARE_PROVIDER_SITE_OTHER): Payer: Medicaid Other | Admitting: Certified Nurse Midwife

## 2018-01-22 VITALS — BP 117/71 | HR 92 | Wt 176.0 lb

## 2018-01-22 DIAGNOSIS — O26899 Other specified pregnancy related conditions, unspecified trimester: Secondary | ICD-10-CM

## 2018-01-22 DIAGNOSIS — O26892 Other specified pregnancy related conditions, second trimester: Secondary | ICD-10-CM

## 2018-01-22 DIAGNOSIS — R102 Pelvic and perineal pain: Secondary | ICD-10-CM

## 2018-01-22 DIAGNOSIS — Z3402 Encounter for supervision of normal first pregnancy, second trimester: Secondary | ICD-10-CM

## 2018-01-22 DIAGNOSIS — Z3401 Encounter for supervision of normal first pregnancy, first trimester: Secondary | ICD-10-CM

## 2018-01-22 MED ORDER — COMFORT FIT MATERNITY SUPP LG MISC: 1.0000 [IU] | Freq: Every day | 0 refills | Status: DC

## 2018-01-22 NOTE — Progress Notes (Signed)
   PRENATAL VISIT NOTE  Subjective:  Michelle Taylor is a 27 y.o. G1P0 at [redacted]w[redacted]d being seen today for ongoing prenatal care.  She is currently monitored for the following issues for this low-risk pregnancy and has Encounter for supervision of normal first pregnancy in first trimester and Prediabetes on their problem list.  Patient reports nasal congestion, sore breast and hip pain.  Contractions: Not present. Vag. Bleeding: None.  Movement: Present. Denies leaking of fluid.   The following portions of the patient's history were reviewed and updated as appropriate: allergies, current medications, past family history, past medical history, past social history, past surgical history and problem list. Problem list updated.  Objective:   Vitals:   01/22/18 1007  BP: 117/71  Pulse: 92  Weight: 176 lb (79.8 kg)    Fetal Status: Fetal Heart Rate (bpm): 132 Fundal Height: 26 cm Movement: Present     General:  Alert, oriented and cooperative. Patient is in no acute distress.  Skin: Skin is warm and dry. No rash noted.   Cardiovascular: Normal heart rate noted  Respiratory: Normal respiratory effort, no problems with respiration noted  Abdomen: Soft, gravid, appropriate for gestational age.  Pain/Pressure: Absent     Pelvic: Cervical exam deferred        Extremities: Normal range of motion.  Edema: Trace  Mental Status: Normal mood and affect. Normal behavior. Normal judgment and thought content.   Assessment and Plan:  Pregnancy: G1P0 at [redacted]w[redacted]d  1. Encounter for supervision of normal first pregnancy in first trimester - Patient doing well she reports occasional nasal congestion when she goes outside due to the weather changes, no congestion currently but does not know what medication she can take for it. Discussed safe medication during pregnancy and list given. 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 Work note given to address frequent break, comfortable shoes and amount of time at work.   2. Pain of round ligament during pregnancy - Reports occasional sharp pain throughout groin and hip when she moves, sits and the most at work  - Discussed changes of body during pregnancy including breast changes and increased pressure in hip/groin due to positioning and muscles stretching.  - Elastic Bandages & Supports (COMFORT FIT MATERNITY SUPP LG) MISC; 1 Units by Does not apply route daily.  Dispense: 1 each; Refill: 0  Preterm labor symptoms and general obstetric precautions including but not limited to vaginal bleeding, contractions, leaking of fluid and fetal movement were reviewed in detail with the patient. Please refer to After Visit Summary for other counseling recommendations.  Work note given  List of safe medications during pregnancy  Return in about 4 weeks (around 02/19/2018) for ROB/2hrGTT/labs.  Future Appointments  Date Time Provider Department Center  01/28/2018 10:15 AM [facility] [place] 4 [facility]  02/19/2018  8:15 AM [facility] None  02/19/2018  8:30 AM Tamera Stands, DO [facility] None    Sharyon Cable, [place]

## 2018-01-22 NOTE — Progress Notes (Signed)
CC: Nasal congestion , hip pain, breast sore.  Pt will wait to get flu vaccine after sx's clear.  Needs notes for work for extended breaks , comfortable shoes working no more than 8 hrs a day 5 days a week.

## 2018-01-22 NOTE — Patient Instructions (Addendum)
Safe Medications in Pregnancy   Acne: Benzoyl Peroxide Salicylic Acid  Backache/Headache: Tylenol: 2 regular strength every 4 hours OR              2 Extra strength every 6 hours  Colds/Coughs/Allergies: Benadryl (alcohol free) 25 mg every 6 hours as needed Breath right strips Claritin Cepacol throat lozenges Chloraseptic throat spray Cold-Eeze- up to three times per day Cough drops, alcohol free Flonase (by prescription only) Guaifenesin Mucinex Robitussin DM (plain only, alcohol free) Saline nasal spray/drops Sudafed (pseudoephedrine) & Actifed ** use only after [redacted] weeks gestation and if you do not have high blood pressure Tylenol Vicks Vaporub Zinc lozenges Zyrtec   Constipation: Colace Ducolax suppositories Fleet enema Glycerin suppositories Metamucil Milk of magnesia Miralax Senokot Smooth move tea  Diarrhea: Kaopectate Imodium A-D  *NO pepto Bismol  Hemorrhoids: Anusol Anusol HC Preparation H Tucks  Indigestion: Tums Maalox Mylanta Zantac  Pepcid  Insomnia: Benadryl (alcohol free) 25mg every 6 hours as needed Tylenol PM Unisom, no Gelcaps  Leg Cramps: Tums MagGel  Nausea/Vomiting:  Bonine Dramamine Emetrol Ginger extract Sea bands Meclizine  Nausea medication to take during pregnancy:  Unisom (doxylamine succinate 25 mg tablets) Take one tablet daily at bedtime. If symptoms are not adequately controlled, the dose can be increased to a maximum recommended dose of two tablets daily (1/2 tablet in the morning, 1/2 tablet mid-afternoon and one at bedtime). Vitamin B6 100mg tablets. Take one tablet twice a day (up to 200 mg per day).  Skin Rashes: Aveeno products Benadryl cream or 25mg every 6 hours as needed Calamine Lotion 1% cortisone cream  Yeast infection: Gyne-lotrimin 7 Monistat 7   **If taking multiple medications, please check labels to avoid duplicating the same active ingredients **take medication as directed on  the label ** Do not exceed 4000 mg of tylenol in 24 hours **Do not take medications that contain aspirin or ibuprofen    Glucose Tolerance Test During Pregnancy The glucose tolerance test (GTT) is a blood test used to determine if you have developed a type of diabetes during pregnancy (gestational diabetes). This is when your body does not properly process sugar (glucose) in the food you eat, resulting in high blood glucose levels. Typically, a GTT is done after you have had a 1-hour glucose test with results that indicate you possibly have gestational diabetes. It may also be done if:  You have a history of giving birth to very large babies or have experienced repeated fetal loss (stillbirth).  You have signs and symptoms of diabetes, such as: ? Changes in your vision. ? Tingling or numbness in your hands or feet. ? Changes in hunger, thirst, and urination not otherwise explained by your pregnancy.  The GTT lasts about 3 hours. You will be given a sugar-water solution to drink at the beginning of the test. You will have blood drawn before you drink the solution and then again 1, 2, and 3 hours after you drink it. You will not be allowed to eat or drink anything else during the test. You must remain at the testing location to make sure that your blood is drawn on time. You should also avoid exercising during the test, because exercise can alter test results. How do I prepare for this test? Eat normally for 3 days prior to the GTT test, including having plenty of carbohydrate-rich foods. Do not eat or drink anything except water during the final 12 hours before the test. In addition, your health care provider   may ask you to stop taking certain medicines before the test. What do the results mean? It is your responsibility to obtain your test results. Ask the lab or department performing the test when and how you will get your results. Contact your health care provider to discuss any questions you  have about your results. Range of Normal Values Ranges for normal values may vary among different labs and hospitals. You should always check with your health care provider after having lab work or other tests done to discuss whether your values are considered within normal limits. Normal levels of blood glucose are as follows:  Fasting: less than 105 mg/dL.  1 hour after drinking the solution: less than 190 mg/dL.  2 hours after drinking the solution: less than 165 mg/dL.  3 hours after drinking the solution: less than 145 mg/dL.  Some substances can interfere with GTT results. These may include:  Blood pressure and heart failure medicines, including beta blockers, furosemide, and thiazides.  Anti-inflammatory medicines, including aspirin.  Nicotine.  Some psychiatric medicines.  Meaning of Results Outside Normal Value Ranges GTT test results that are above normal values may indicate a number of health problems, such as:  Gestational diabetes.  Acute stress response.  Cushing syndrome.  Tumors such as pheochromocytoma or glucagonoma.  Long-term kidney problems.  Pancreatitis.  Hyperthyroidism.  Current infection.  Discuss your test results with your health care provider. He or she will use the results to make a diagnosis and determine a treatment plan that is right for you. This information is not intended to replace advice given to you by your health care provider. Make sure you discuss any questions you have with your health care provider. Document Released: 09/17/2011 Document Revised: 08/24/2015 Document Reviewed: 07/23/2013 Elsevier Interactive Patient Education  2018 Elsevier Inc.  

## 2018-01-28 ENCOUNTER — Ambulatory Visit (HOSPITAL_COMMUNITY)
Admission: RE | Admit: 2018-01-28 | Discharge: 2018-01-28 | Disposition: A | Discharge status: Home / Self Care | Payer: Medicaid Other | Source: Ambulatory Visit | Attending: Advanced Practice Midwife | Admitting: Advanced Practice Midwife

## 2018-01-28 DIAGNOSIS — Z3A25 25 weeks gestation of pregnancy: Secondary | ICD-10-CM | POA: Insufficient documentation

## 2018-01-28 DIAGNOSIS — Z362 Encounter for other antenatal screening follow-up: Secondary | ICD-10-CM | POA: Diagnosis present

## 2018-02-19 ENCOUNTER — Other Ambulatory Visit: Payer: Medicaid Other

## 2018-02-19 ENCOUNTER — Encounter: Payer: Self-pay | Admitting: Family Medicine

## 2018-02-19 ENCOUNTER — Ambulatory Visit (INDEPENDENT_AMBULATORY_CARE_PROVIDER_SITE_OTHER): Payer: Managed Care, Other (non HMO) | Admitting: Family Medicine

## 2018-02-19 DIAGNOSIS — Z3401 Encounter for supervision of normal first pregnancy, first trimester: Secondary | ICD-10-CM

## 2018-02-19 DIAGNOSIS — Z23 Encounter for immunization: Secondary | ICD-10-CM | POA: Diagnosis not present

## 2018-02-19 NOTE — Patient Instructions (Signed)
We will send you a MyChart message when your lab results are back.

## 2018-02-19 NOTE — Progress Notes (Signed)
   PRENATAL VISIT NOTE Subjective:  Michelle Taylor is a 27 y.o. G1P0 at 387w3d being seen today for ongoing prenatal care.  She is currently monitored for the following issues for this low-risk pregnancy and has Encounter for supervision of normal first pregnancy in first trimester and Prediabetes on their problem list.  Patient reports no complaints.  Contractions: Not present. Vag. Bleeding: None.  Movement: Present. Denies leaking of fluid.   The following portions of the patient's history were reviewed and updated as appropriate: allergies, current medications, past family history, past medical history, past social history, past surgical history and problem list. Problem list updated.  Objective:   Vitals:   02/19/18 0831 02/19/18 0833  BP: (!) 130/92 119/81  Pulse: (!) 105   Weight: 181 lb 8 oz (82.3 kg)    Fetal Status: Fetal Heart Rate (bpm): 144 Fundal Height: 29 cm Movement: Present     General:  Alert, oriented and cooperative. Patient is in no acute distress.  Skin: Skin is warm and dry. No rash noted.   Cardiovascular: Normal heart rate noted  Respiratory: Normal respiratory effort, no problems with respiration noted  Abdomen: Soft, gravid, appropriate for gestational age.  Pain/Pressure: Absent     Pelvic: Cervical exam deferred        Extremities: Normal range of motion.  Edema: Trace  Mental Status: Normal mood and affect. Normal behavior. Normal judgment and thought content.   Assessment and Plan:  Pregnancy: G1P0 at 397w3d  1. Encounter for supervision of normal first pregnancy in first trimester -- prenatal record reviewed and UTD  -- Tdap vaccine greater than or equal to 7yo IM -- Glucose Tolerance, 2 Hours w/1 Hour -- HIV Antibody (routine testing w rflx) -- RPR -- CBC -- counseled on normal weight gain in pregnancy  encouraged partner to get Tdap vaccine   Preterm labor symptoms and general obstetric precautions including but not limited to vaginal  bleeding, contractions, leaking of fluid and fetal movement were reviewed in detail with the patient. Please refer to After Visit Summary for other counseling recommendations.   Return in about 2 weeks (around 03/05/2018) for ROB.  Tamera StandsLaurel S Kasten Leveque, DO

## 2018-02-20 ENCOUNTER — Other Ambulatory Visit: Payer: Self-pay | Admitting: Family Medicine

## 2018-02-20 DIAGNOSIS — O24419 Gestational diabetes mellitus in pregnancy, unspecified control: Secondary | ICD-10-CM | POA: Insufficient documentation

## 2018-02-20 LAB — CBC
Hematocrit: 34.7 % (ref 34.0–46.6)
Hemoglobin: 11.5 g/dL (ref 11.1–15.9)
MCH: 30.8 pg (ref 26.6–33.0)
MCHC: 33.1 g/dL (ref 31.5–35.7)
MCV: 93 fL (ref 79–97)
Platelets: 315 10*3/uL (ref 150–450)
RBC: 3.73 x10E6/uL — ABNORMAL LOW (ref 3.77–5.28)
RDW: 12.2 % — ABNORMAL LOW (ref 12.3–15.4)
WBC: 6.3 10*3/uL (ref 3.4–10.8)

## 2018-02-20 LAB — HIV ANTIBODY (ROUTINE TESTING W REFLEX): HIV SCREEN 4TH GENERATION: NONREACTIVE

## 2018-02-20 LAB — RPR: RPR Ser Ql: NONREACTIVE

## 2018-02-20 LAB — GLUCOSE TOLERANCE, 2 HOURS W/ 1HR
GLUCOSE, FASTING: 87 mg/dL (ref 65–91)
Glucose, 1 hour: 178 mg/dL (ref 65–179)
Glucose, 2 hour: 158 mg/dL — ABNORMAL HIGH (ref 65–152)

## 2018-02-20 MED ORDER — FREESTYLE SYSTEM KIT: PACK | 0 refills | Status: DC

## 2018-02-20 MED ORDER — GLUCOSE BLOOD VI STRP: ORAL_STRIP | 12 refills | Status: DC

## 2018-02-20 MED ORDER — LANCETS 28G MISC: 12 refills | Status: DC

## 2018-02-23 ENCOUNTER — Other Ambulatory Visit: Payer: Self-pay | Admitting: Family Medicine

## 2018-02-23 DIAGNOSIS — O24419 Gestational diabetes mellitus in pregnancy, unspecified control: Secondary | ICD-10-CM

## 2018-02-25 ENCOUNTER — Encounter: Payer: Self-pay | Admitting: Registered"

## 2018-02-25 ENCOUNTER — Encounter: Payer: Medicaid Other | Attending: Advanced Practice Midwife | Admitting: Registered"

## 2018-02-25 DIAGNOSIS — O24419 Gestational diabetes mellitus in pregnancy, unspecified control: Secondary | ICD-10-CM | POA: Diagnosis present

## 2018-02-25 DIAGNOSIS — Z3A00 Weeks of gestation of pregnancy not specified: Secondary | ICD-10-CM | POA: Diagnosis not present

## 2018-02-25 DIAGNOSIS — Z713 Dietary counseling and surveillance: Secondary | ICD-10-CM | POA: Diagnosis not present

## 2018-02-25 DIAGNOSIS — O99810 Abnormal glucose complicating pregnancy: Secondary | ICD-10-CM

## 2018-02-25 NOTE — Progress Notes (Signed)
Patient was seen on 02/25/18 for Gestational Diabetes self-management class at the [facility]. The following learning objectives were met by the patient during this course:   States the definition of Gestational Diabetes  States why dietary management is important in controlling blood glucose  Describes the effects each nutrient has on blood glucose levels  Demonstrates ability to create a balanced meal plan  Demonstrates carbohydrate counting   States when to check blood glucose levels  Demonstrates proper blood glucose monitoring techniques  States the effect of stress and exercise on blood glucose levels  States the importance of limiting caffeine and abstaining from alcohol and smoking  Blood glucose monitor given: Accu check guide me Lot # [id number] Exp: 03/04/19 Blood glucose reading: 95 mg/dL  Patient instructed to monitor glucose levels: FBS: 60 - <95; 1 hour: <140; 2 hour: <120  Patient received handouts:  Nutrition Diabetes and Pregnancy, including carb counting list  Patient will be seen for follow-up as needed.

## 2018-03-05 ENCOUNTER — Encounter: Payer: Self-pay | Admitting: Obstetrics and Gynecology

## 2018-03-05 ENCOUNTER — Ambulatory Visit (INDEPENDENT_AMBULATORY_CARE_PROVIDER_SITE_OTHER): Payer: Managed Care, Other (non HMO) | Admitting: Obstetrics and Gynecology

## 2018-03-05 VITALS — BP 122/79 | HR 98 | Wt 184.7 lb

## 2018-03-05 DIAGNOSIS — Z3403 Encounter for supervision of normal first pregnancy, third trimester: Secondary | ICD-10-CM

## 2018-03-05 DIAGNOSIS — Z3401 Encounter for supervision of normal first pregnancy, first trimester: Secondary | ICD-10-CM

## 2018-03-05 DIAGNOSIS — O24419 Gestational diabetes mellitus in pregnancy, unspecified control: Secondary | ICD-10-CM

## 2018-03-05 DIAGNOSIS — Z3A30 30 weeks gestation of pregnancy: Secondary | ICD-10-CM

## 2018-03-05 NOTE — Progress Notes (Signed)
   PRENATAL VISIT NOTE  Subjective:  Michelle Taylor is a 27 y.o. G1P0 at 2924w3d being seen today for ongoing prenatal care.  She is currently monitored for the following issues for this high-risk pregnancy and has Encounter for supervision of normal first pregnancy in first trimester; Prediabetes; Gestational diabetes; and Abnormal glucose tolerance test (GTT) during pregnancy, antepartum on their problem list.  Patient reports no complaints.  Contractions: Not present. Vag. Bleeding: None.  Movement: Present. Denies leaking of fluid.   The following portions of the patient's history were reviewed and updated as appropriate: allergies, current medications, past family history, past medical history, past social history, past surgical history and problem list. Problem list updated.  Objective:   Vitals:   03/05/18 1528  BP: 122/79  Pulse: 98  Weight: 184 lb 11.2 oz (83.8 kg)    Fetal Status: Fetal Heart Rate (bpm): 142 Fundal Height: 30 cm Movement: Present     General:  Alert, oriented and cooperative. Patient is in no acute distress.  Skin: Skin is warm and dry. No rash noted.   Cardiovascular: Normal heart rate noted  Respiratory: Normal respiratory effort, no problems with respiration noted  Abdomen: Soft, gravid, appropriate for gestational age.  Pain/Pressure: Absent     Pelvic: Cervical exam deferred        Extremities: Normal range of motion.  Edema: Trace  Mental Status: Normal mood and affect. Normal behavior. Normal judgment and thought content.   Assessment and Plan:  Pregnancy: G1P0 at 1724w3d  1. Encounter for supervision of normal first pregnancy in first trimester Patient is doing well without complaints   2. Gestational diabetes mellitus (GDM) in third trimester, gestational diabetes method of control unspecified Patient did not bring CBG log but reports highest fasting 94 and highest postprandial 124 Continue with diet control Patient to bring log and meter next  visit  Preterm labor symptoms and general obstetric precautions including but not limited to vaginal bleeding, contractions, leaking of fluid and fetal movement were reviewed in detail with the patient. Please refer to After Visit Summary for other counseling recommendations.  Return in about 2 weeks (around 03/19/2018) for ROB.  No future appointments.  Catalina AntiguaPeggy Adilson Grafton, MD

## 2018-03-05 NOTE — Progress Notes (Signed)
Pt is here for ROB G1P0 6862w3d.

## 2018-03-10 ENCOUNTER — Other Ambulatory Visit: Payer: Self-pay

## 2018-03-10 ENCOUNTER — Encounter: Payer: Self-pay | Admitting: Obstetrics and Gynecology

## 2018-03-10 ENCOUNTER — Other Ambulatory Visit (HOSPITAL_COMMUNITY)
Admission: RE | Admit: 2018-03-10 | Discharge: 2018-03-10 | Disposition: A | Discharge status: Home / Self Care | Payer: Medicaid Other | Source: Ambulatory Visit | Attending: Obstetrics and Gynecology | Admitting: Obstetrics and Gynecology

## 2018-03-10 ENCOUNTER — Ambulatory Visit (INDEPENDENT_AMBULATORY_CARE_PROVIDER_SITE_OTHER): Payer: Managed Care, Other (non HMO) | Admitting: Obstetrics and Gynecology

## 2018-03-10 VITALS — BP 124/85 | HR 107 | Wt 185.3 lb

## 2018-03-10 DIAGNOSIS — Z3A31 31 weeks gestation of pregnancy: Secondary | ICD-10-CM | POA: Diagnosis not present

## 2018-03-10 DIAGNOSIS — O24419 Gestational diabetes mellitus in pregnancy, unspecified control: Secondary | ICD-10-CM | POA: Diagnosis not present

## 2018-03-10 DIAGNOSIS — Z3401 Encounter for supervision of normal first pregnancy, first trimester: Secondary | ICD-10-CM | POA: Diagnosis not present

## 2018-03-10 DIAGNOSIS — Z3403 Encounter for supervision of normal first pregnancy, third trimester: Secondary | ICD-10-CM

## 2018-03-10 LAB — POCT URINALYSIS DIPSTICK
Bilirubin, UA: NEGATIVE
Blood, UA: NEGATIVE
Glucose, UA: NEGATIVE
Nitrite, UA: NEGATIVE
Protein, UA: NEGATIVE
SPEC GRAV UA: 1.015 (ref 1.010–1.025)
Urobilinogen, UA: 0.2 E.U./dL
pH, UA: 5 (ref 5.0–8.0)

## 2018-03-10 NOTE — Progress Notes (Signed)
   PRENATAL VISIT NOTE  Subjective:  Michelle Taylor is a 27 y.o. G1P0 at 681w1d being seen today for ongoing prenatal care.  She is currently monitored for the following issues for this high-risk pregnancy and has Encounter for supervision of normal first pregnancy in first trimester; Prediabetes; Gestational diabetes; and Abnormal glucose tolerance test (GTT) during pregnancy, antepartum on their problem list.  Patient reports vaginal pruritis for 1 week and lower extremity edema and soreness.  Contractions: Regular. Vag. Bleeding: None.  Movement: Present. Denies leaking of fluid.   The following portions of the patient's history were reviewed and updated as appropriate: allergies, current medications, past family history, past medical history, past social history, past surgical history and problem list. Problem list updated.  Objective:   Vitals:   03/10/18 1413  BP: 124/85  Pulse: (!) 107  Weight: 185 lb 4.8 oz (84.1 kg)    Fetal Status: Fetal Heart Rate (bpm): 154 Fundal Height: 31 cm Movement: Present     General:  Alert, oriented and cooperative. Patient is in no acute distress.  Skin: Skin is warm and dry. No rash noted.   Cardiovascular: Normal heart rate noted  Respiratory: Normal respiratory effort, no problems with respiration noted  Abdomen: Soft, gravid, appropriate for gestational age.  Pain/Pressure: Absent     Pelvic: Cervical exam deferred        Extremities: Normal range of motion.  Edema: Trace  Mental Status: Normal mood and affect. Normal behavior. Normal judgment and thought content.   Assessment and Plan:  Pregnancy: G1P0 at 561w1d  1. Encounter for supervision of normal first pregnancy in first trimester Cervical swab collected Urine culture collected Encouraged the use of pressure stocking  2. Gestational diabetes mellitus (GDM) in third trimester, gestational diabetes method of control unspecified Patient reports CBG all in range Continue  diet  Preterm labor symptoms and general obstetric precautions including but not limited to vaginal bleeding, contractions, leaking of fluid and fetal movement were reviewed in detail with the patient. Please refer to After Visit Summary for other counseling recommendations.  No follow-ups on file.  Future Appointments  Date Time Provider Department Center  03/20/2018  8:45 AM Conan Bowensavis, Kelly M, MD [facility] None    Catalina AntiguaPeggy Lovie Agresta, MD

## 2018-03-10 NOTE — Progress Notes (Signed)
ROB.  C/o vaginal itching on the outside  Apex of labia x 1 week, denies odor, discharge, NV, pain.  Leg cramp/soreness x 1 week.

## 2018-03-12 LAB — URINE CULTURE, OB REFLEX

## 2018-03-12 LAB — CULTURE, OB URINE

## 2018-03-13 LAB — CERVICOVAGINAL ANCILLARY ONLY
Bacterial vaginitis: NEGATIVE
Candida vaginitis: POSITIVE — AB

## 2018-03-14 MED ORDER — FLUCONAZOLE 150 MG PO TABS: 150.0000 mg | ORAL_TABLET | Freq: Once | ORAL | 0 refills | Status: AC

## 2018-03-14 NOTE — Addendum Note (Signed)
Addended by: Catalina AntiguaONSTANT, Kiele Heavrin on: 03/14/2018 12:23 PM   Modules accepted: Orders

## 2018-03-20 ENCOUNTER — Encounter: Payer: Self-pay | Admitting: Obstetrics and Gynecology

## 2018-03-20 ENCOUNTER — Ambulatory Visit (HOSPITAL_COMMUNITY)
Admission: RE | Admit: 2018-03-20 | Discharge: 2018-03-20 | Disposition: A | Discharge status: Home / Self Care | Payer: Medicaid Other | Source: Ambulatory Visit | Attending: Obstetrics and Gynecology | Admitting: Obstetrics and Gynecology

## 2018-03-20 ENCOUNTER — Ambulatory Visit (INDEPENDENT_AMBULATORY_CARE_PROVIDER_SITE_OTHER): Payer: Managed Care, Other (non HMO) | Admitting: Obstetrics and Gynecology

## 2018-03-20 VITALS — BP 132/86 | HR 91 | Wt 186.0 lb

## 2018-03-20 DIAGNOSIS — M79661 Pain in right lower leg: Secondary | ICD-10-CM

## 2018-03-20 DIAGNOSIS — Z3A32 32 weeks gestation of pregnancy: Secondary | ICD-10-CM

## 2018-03-20 DIAGNOSIS — M79662 Pain in left lower leg: Secondary | ICD-10-CM

## 2018-03-20 DIAGNOSIS — Z3403 Encounter for supervision of normal first pregnancy, third trimester: Secondary | ICD-10-CM

## 2018-03-20 DIAGNOSIS — O24410 Gestational diabetes mellitus in pregnancy, diet controlled: Secondary | ICD-10-CM

## 2018-03-20 DIAGNOSIS — Z3401 Encounter for supervision of normal first pregnancy, first trimester: Secondary | ICD-10-CM

## 2018-03-20 NOTE — Progress Notes (Signed)
   PRENATAL VISIT NOTE  Subjective:  Michelle Taylor is a 27 y.o. G1P0 at 88w4dbeing seen today for ongoing prenatal care.  She is currently monitored for the following issues for this low-risk pregnancy and has Encounter for supervision of normal first pregnancy in first trimester; Prediabetes; Gestational diabetes; and Abnormal glucose tolerance test (GTT) during pregnancy, antepartum on their problem list.  Patient reports pain in calves bilaterally, started 3rd trimester, stretching makes it worse.  Contractions: Not present. Vag. Bleeding: None.  Movement: Present. Denies leaking of fluid.   The following portions of the patient's history were reviewed and updated as appropriate: allergies, current medications, past family history, past medical history, past social history, past surgical history and problem list. Problem list updated.  Objective:   Vitals:   03/20/18 0902  BP: 132/86  Pulse: 91  Weight: 186 lb (84.4 kg)    Fetal Status: Fetal Heart Rate (bpm): 148   Movement: Present     General:  Alert, oriented and cooperative. Patient is in no acute distress.  Skin: Skin is warm and dry. No rash noted.   Cardiovascular: Normal heart rate noted  Respiratory: Normal respiratory effort, no problems with respiration noted  Abdomen: Soft, gravid, appropriate for gestational age.  Pain/Pressure: Present     Pelvic: Cervical exam deferred        Extremities: Normal range of motion.  Edema: Trace  Mental Status: Normal mood and affect. Normal behavior. Normal judgment and thought content.   Assessment and Plan:  Pregnancy: G1P0 at 365w4d1. Encounter for supervision of normal first pregnancy in first trimester   2. Diet controlled gestational diabetes mellitus (GDM) in third trimester - Did not bring log, encouraged her to bring logs - Reports fasting is always < 90 - Reports post breakfast and lunch always < 120 and dinner usually around 125-128 -[place] OB FOLLOW UP;  Future  3. Bilateral calf pain Encouraged hydration - Comp Met (CMET) - lower extremity doppler ordered - to go to MAU with worsening/acute pain   Preterm labor symptoms and general obstetric precautions including but not limited to vaginal bleeding, contractions, leaking of fluid and fetal movement were reviewed in detail with the patient. Please refer to After Visit Summary for other counseling recommendations.  Return in about 2 weeks (around 04/03/2018) for OB visit (MD).  Future Appointments  Date Time Provider [place]12/20/2019  3:00 PM [facility]  4:15 PM Constant, PeVickii ChafeMD [place]  04/20/2018  8:45 AM WH-MFC [place] [facility]-MFCUS [facility]    KeSloan LeiterMD

## 2018-03-20 NOTE — Progress Notes (Signed)
Lower extremity venous duplex completed. Refer to "CV Proc" under chart review to view preliminary results.  03/20/2018 3:52 PM Gertie FeyMichelle Brysin Towery, MHA, RVT, RDCS, RDMS

## 2018-03-20 NOTE — Progress Notes (Signed)
Pt c/o of soreness in Calf denies any SOB  No HA's no visual changes.

## 2018-03-21 LAB — COMPREHENSIVE METABOLIC PANEL
ALBUMIN: 3.3 g/dL — AB (ref 3.5–5.5)
ALT: 13 IU/L (ref 0–32)
AST: 16 IU/L (ref 0–40)
Albumin/Globulin Ratio: 1 — ABNORMAL LOW (ref 1.2–2.2)
Alkaline Phosphatase: 103 IU/L (ref 39–117)
BUN/Creatinine Ratio: 8 — ABNORMAL LOW (ref 9–23)
BUN: 4 mg/dL — ABNORMAL LOW (ref 6–20)
Bilirubin Total: 0.3 mg/dL (ref 0.0–1.2)
CALCIUM: 10.3 mg/dL — AB (ref 8.7–10.2)
CO2: 22 mmol/L (ref 20–29)
CREATININE: 0.52 mg/dL — AB (ref 0.57–1.00)
Chloride: 103 mmol/L (ref 96–106)
GFR calc Af Amer: 151 mL/min/{1.73_m2} (ref >59)
GFR calc non Af Amer: 131 mL/min/{1.73_m2} (ref >59)
GLOBULIN, TOTAL: 3.2 g/dL (ref 1.5–4.5)
Glucose: 80 mg/dL (ref 65–99)
Potassium: 4.8 mmol/L (ref 3.5–5.2)
SODIUM: 138 mmol/L (ref 134–144)
Total Protein: 6.5 g/dL (ref 6.0–8.5)

## 2018-04-01 NOTE — L&D Delivery Note (Addendum)
Delivery Note At 8:37 PM a viable and healthy female was delivered via Vaginal, Spontaneous (Presentation: LOA; Cephalic  ).  APGARs please refer to NICU note   Called to room due to recurrent fetal bradycardia with contractions after patient become complete. Dr. Debroah Loop called to room. Patient able to push baby to +3 station but due to bradycardia a Kiwi suction device was applied to fetal scalp by Dr. Debroah Loop. Fetal head was able to be successfully extracted with one pop-off. After head extraction baby was delivered in usual fashion without complications. Patient did have loose nuchal cord x1 wrapped around neck and shoulders. Cord cut after 1 minute, and baby was taken to nicu.  Placenta status: spontaneous, intact.  Cord: 3 vessel  with no placental complications:  Anesthesia:  Epidural, lidocaine Episiotomy: None Lacerations:  1st degree periurethral Suture Repair: 4.0 monocryl Est. Blood Loss (mL):   Mom to postpartum.  Baby to Couplet care / Skin to Skin.  Myrene Buddy 04/07/2018, 9:03 PM  I confirm that I have verified the information documented in the resident's note and that I have also personally reperformed the physical exam and all medical decision making activities.  I was gloved and present for entire delivery, Dr Debroah Loop did the Vacuum assist and Resident completed the delivery SVD without incident No difficulty with shoulders Lacerations as listed above Repair of same supervised by me Aviva Signs, CNM  Please schedule this patient for Postpartum visit in: 1 week with the following provider: Any provider For C/S patients schedule nurse incision check in weeks 2 weeks: no High risk pregnancy complicated by: HTN Delivery mode:  Vacuum Anticipated Birth Control:  OCPs PP Procedures needed: BP check  Schedule Integrated BH visit: no

## 2018-04-06 ENCOUNTER — Encounter (HOSPITAL_COMMUNITY): Payer: Self-pay

## 2018-04-06 ENCOUNTER — Other Ambulatory Visit: Payer: Self-pay

## 2018-04-06 ENCOUNTER — Encounter: Payer: Self-pay | Admitting: Obstetrics and Gynecology

## 2018-04-06 ENCOUNTER — Ambulatory Visit (INDEPENDENT_AMBULATORY_CARE_PROVIDER_SITE_OTHER): Payer: Managed Care, Other (non HMO) | Admitting: Obstetrics and Gynecology

## 2018-04-06 ENCOUNTER — Inpatient Hospital Stay (HOSPITAL_COMMUNITY)
Admission: AD | Admit: 2018-04-06 | Discharge: 2018-04-09 | DRG: 807 | Disposition: A | Discharge status: Home / Self Care | Payer: BLUE CROSS/BLUE SHIELD | Attending: Obstetrics & Gynecology | Admitting: Obstetrics & Gynecology

## 2018-04-06 VITALS — BP 153/100 | HR 85 | Wt 189.7 lb

## 2018-04-06 DIAGNOSIS — Z3A35 35 weeks gestation of pregnancy: Secondary | ICD-10-CM | POA: Diagnosis not present

## 2018-04-06 DIAGNOSIS — O24410 Gestational diabetes mellitus in pregnancy, diet controlled: Secondary | ICD-10-CM

## 2018-04-06 DIAGNOSIS — O24429 Gestational diabetes mellitus in childbirth, unspecified control: Secondary | ICD-10-CM | POA: Diagnosis not present

## 2018-04-06 DIAGNOSIS — Z87891 Personal history of nicotine dependence: Secondary | ICD-10-CM

## 2018-04-06 DIAGNOSIS — O24419 Gestational diabetes mellitus in pregnancy, unspecified control: Secondary | ICD-10-CM | POA: Diagnosis present

## 2018-04-06 DIAGNOSIS — O24420 Gestational diabetes mellitus in childbirth, diet controlled: Secondary | ICD-10-CM | POA: Diagnosis not present

## 2018-04-06 DIAGNOSIS — R7303 Prediabetes: Secondary | ICD-10-CM | POA: Diagnosis present

## 2018-04-06 DIAGNOSIS — O1414 Severe pre-eclampsia complicating childbirth: Principal | ICD-10-CM | POA: Diagnosis present

## 2018-04-06 DIAGNOSIS — O1494 Unspecified pre-eclampsia, complicating childbirth: Secondary | ICD-10-CM | POA: Diagnosis not present

## 2018-04-06 DIAGNOSIS — O1413 Severe pre-eclampsia, third trimester: Secondary | ICD-10-CM | POA: Diagnosis present

## 2018-04-06 DIAGNOSIS — Z3401 Encounter for supervision of normal first pregnancy, first trimester: Secondary | ICD-10-CM

## 2018-04-06 DIAGNOSIS — Z3403 Encounter for supervision of normal first pregnancy, third trimester: Secondary | ICD-10-CM

## 2018-04-06 HISTORY — DX: Chlamydial infection, unspecified: A74.9

## 2018-04-06 HISTORY — DX: Gestational diabetes mellitus in pregnancy, unspecified control: O24.419

## 2018-04-06 HISTORY — DX: Candidiasis, unspecified: B37.9

## 2018-04-06 LAB — COMPREHENSIVE METABOLIC PANEL
ALT: 14 U/L (ref 0–44)
AST: 16 U/L (ref 15–41)
Albumin: 2.7 g/dL — ABNORMAL LOW (ref 3.5–5.0)
Alkaline Phosphatase: 100 U/L (ref 38–126)
Anion gap: 6 (ref 5–15)
BUN: <5 mg/dL — ABNORMAL LOW (ref 6–20)
CHLORIDE: 105 mmol/L (ref 98–111)
CO2: 23 mmol/L (ref 22–32)
Calcium: 8.9 mg/dL (ref 8.9–10.3)
Creatinine, Ser: 0.57 mg/dL (ref 0.44–1.00)
GFR calc Af Amer: >60 mL/min (ref >60)
GFR calc non Af Amer: >60 mL/min (ref >60)
Glucose, Bld: 98 mg/dL (ref 70–99)
Potassium: 3.3 mmol/L — ABNORMAL LOW (ref 3.5–5.1)
Sodium: 134 mmol/L — ABNORMAL LOW (ref 135–145)
Total Bilirubin: 0.5 mg/dL (ref 0.3–1.2)
Total Protein: 6.7 g/dL (ref 6.5–8.1)

## 2018-04-06 LAB — CBC
HEMATOCRIT: 34.5 % — AB (ref 36.0–46.0)
Hemoglobin: 11.5 g/dL — ABNORMAL LOW (ref 12.0–15.0)
MCH: 30.3 pg (ref 26.0–34.0)
MCHC: 33.3 g/dL (ref 30.0–36.0)
MCV: 90.8 fL (ref 80.0–100.0)
Platelets: 308 10*3/uL (ref 150–400)
RBC: 3.8 MIL/uL — ABNORMAL LOW (ref 3.87–5.11)
RDW: 13.6 % (ref 11.5–15.5)
WBC: 6.6 10*3/uL (ref 4.0–10.5)
nRBC: 0 % (ref 0.0–0.2)

## 2018-04-06 LAB — PROTEIN / CREATININE RATIO, URINE
Creatinine, Urine: 19 mg/dL
Protein Creatinine Ratio: 0.32 mg/mg{Cre} — ABNORMAL HIGH (ref 0.00–0.15)
Total Protein, Urine: 6 mg/dL

## 2018-04-06 LAB — TYPE AND SCREEN
ABO/RH(D): O POS
Antibody Screen: NEGATIVE

## 2018-04-06 LAB — GLUCOSE, CAPILLARY: Glucose-Capillary: 60 mg/dL — ABNORMAL LOW (ref 70–99)

## 2018-04-06 MED ORDER — LACTATED RINGERS IV SOLN
INTRAVENOUS | Status: DC
  Administered 2018-04-06 – 2018-04-07 (×4): via INTRAVENOUS

## 2018-04-06 MED ORDER — OXYTOCIN BOLUS FROM INFUSION: 500.0000 mL | Freq: Once | INTRAVENOUS | Status: DC

## 2018-04-06 MED ORDER — LACTATED RINGERS IV SOLN
500.0000 mL | INTRAVENOUS | Status: DC | PRN
  Administered 2018-04-07 (×2): 500 mL via INTRAVENOUS

## 2018-04-06 MED ORDER — MISOPROSTOL 25 MCG QUARTER TABLET
25.0000 ug | ORAL_TABLET | ORAL | Status: DC | PRN
  Administered 2018-04-06 – 2018-04-07 (×3): 25 ug via VAGINAL
  Filled 2018-04-06 (×4): qty 1

## 2018-04-06 MED ORDER — NIFEDIPINE 10 MG PO CAPS
10.0000 mg | ORAL_CAPSULE | ORAL | Status: DC | PRN
  Administered 2018-04-06: 10 mg via ORAL
  Filled 2018-04-06: qty 1

## 2018-04-06 MED ORDER — TERBUTALINE SULFATE 1 MG/ML IJ SOLN: 0.2500 mg | Freq: Once | INTRAMUSCULAR | Status: DC | PRN

## 2018-04-06 MED ORDER — MAGNESIUM SULFATE BOLUS VIA INFUSION
4.0000 g | Freq: Once | INTRAVENOUS | Status: AC
  Administered 2018-04-06: 4 g via INTRAVENOUS
  Filled 2018-04-06: qty 500

## 2018-04-06 MED ORDER — PENICILLIN G 3 MILLION UNITS IVPB - SIMPLE MED: 3.0000 10*6.[IU] | INTRAVENOUS | Status: DC

## 2018-04-06 MED ORDER — PENICILLIN G 3 MILLION UNITS IVPB - SIMPLE MED
3.0000 10*6.[IU] | INTRAVENOUS | Status: DC
  Administered 2018-04-07 (×4): 3 10*6.[IU] via INTRAVENOUS
  Filled 2018-04-06 (×8): qty 100

## 2018-04-06 MED ORDER — MAGNESIUM SULFATE 40 G IN LACTATED RINGERS - SIMPLE
2.0000 g/h | INTRAVENOUS | Status: AC
  Administered 2018-04-06: 2 g/h via INTRAVENOUS
  Filled 2018-04-06 (×2): qty 500

## 2018-04-06 MED ORDER — OXYTOCIN 40 UNITS IN LACTATED RINGERS INFUSION - SIMPLE MED: 2.5000 [IU]/h | INTRAVENOUS | Status: DC

## 2018-04-06 MED ORDER — LABETALOL HCL 5 MG/ML IV SOLN: 40.0000 mg | INTRAVENOUS | Status: DC | PRN

## 2018-04-06 MED ORDER — ONDANSETRON HCL 4 MG/2ML IJ SOLN: 4.0000 mg | Freq: Four times a day (QID) | INTRAMUSCULAR | Status: DC | PRN

## 2018-04-06 MED ORDER — NIFEDIPINE 10 MG PO CAPS: 20.0000 mg | ORAL_CAPSULE | ORAL | Status: DC | PRN

## 2018-04-06 MED ORDER — ACETAMINOPHEN 325 MG PO TABS: 650.0000 mg | ORAL_TABLET | ORAL | Status: DC | PRN

## 2018-04-06 MED ORDER — SODIUM CHLORIDE 0.9 % IV SOLN
5.0000 10*6.[IU] | Freq: Once | INTRAVENOUS | Status: AC
  Administered 2018-04-06: 5 10*6.[IU] via INTRAVENOUS
  Filled 2018-04-06: qty 5

## 2018-04-06 MED ORDER — SODIUM CHLORIDE 0.9 % IV SOLN
5.0000 10*6.[IU] | Freq: Once | INTRAVENOUS | Status: DC
  Filled 2018-04-06: qty 5

## 2018-04-06 NOTE — H&P (Addendum)
LABOR AND DELIVERY ADMISSION HISTORY AND PHYSICAL NOTE  Michelle Taylor is a 28 y.o. female G1P0 with IUP at 69w0dby LMP  presenting for IOL for severe Pre-E by BP.  She reports positive fetal movement. She denies leakage of fluid or vaginal bleeding.  Prenatal History/Complications: PNC at [facility] Pregnancy complications:  - Gestational diabetes - diet controlled  - Pre-Eclampsia  Past Medical History: Past Medical History:  Diagnosis Date  . Chlamydia 2008  . Gestational diabetes   . Yeast infection     Past Surgical History: Past Surgical History:  Procedure Laterality Date  . PILONIDAL CYST EXCISION    . WISDOM TOOTH EXTRACTION      Obstetrical History: OB History    Gravida  1   Para      Term      Preterm      AB      Living        SAB      TAB      Ectopic      Multiple      Live Births              Social History: Social History   Socioeconomic History  . Marital status: Single    Spouse name: Not on file  . Number of children: Not on file  . Years of education: Not on file  . Highest education level: Not on file  Occupational History  . Not on file  Social Needs  . Financial resource strain: Not on file  . Food insecurity:    Worry: Not on file    Inability: Not on file  . Transportation needs:    Medical: Not on file    Non-medical: Not on file  Tobacco Use  . Smoking status: Former Smoker    Packs/day: 0.25    Types: Cigarettes  . Smokeless tobacco: Never Used  Substance and Sexual Activity  . Alcohol use: Not Currently  . Drug use: No  . Sexual activity: Yes    Birth control/protection: None  Lifestyle  . Physical activity:    Days per week: Not on file    Minutes per session: Not on file  . Stress: Not on file  Relationships  . Social connections:    Talks on phone: Not on file    Gets together: Not on file    Attends religious service: Not on file    Active member of club or  organization: Not on file    Attends meetings of clubs or organizations: Not on file    Relationship status: Not on file  Other Topics Concern  . Not on file  Social History Narrative  . Not on file    Family History: Family History  Problem Relation Age of Onset  . Hypertension Paternal Grandmother   . Hypertension Paternal Grandfather     Allergies: No Known Allergies  Medications Prior to Admission  Medication Sig Dispense Refill Last Dose  . glucose blood test strip Use as instructed to monitor blood glucose four times daily. ICD O24.419 200 each 12 04/06/2018 at Unknown time  . glucose monitoring kit (FREESTYLE) monitoring kit Use as instructed to monitor blood glucose four times daily. ICD O24.419 1 each 0 04/06/2018 at Unknown time  . Lancets 28G MISC Use as instructed to monitor blood glucose four times daily. ICD O24.419 200 each 12 04/06/2018 at Unknown time  . Prenat-Fe Poly-Methfol-FA-DHA (VITAFOL ULTRA) 29-0.6-0.4-200 MG CAPS Take 1  capsule by mouth daily. 30 capsule 11 04/06/2018 at Unknown time     Review of Systems  All systems reviewed and negative except as stated in HPI  Physical Exam Blood pressure 139/90, pulse 91, temperature 98.4 F (36.9 C), temperature source Oral, resp. rate 17, height 5' (1.524 m), weight 84.5 kg, last menstrual period 08/04/2017, SpO2 100 %. General appearance: alert, oriented, NAD Lungs: normal respiratory effort Heart: regular rate Abdomen: soft, non-tender; gravid, FH appropriate for GA Extremities: No calf swelling or tenderness Presentation: cephalic Fetal monitoring: cat 1 Uterine activity: no contractions Dilation: Closed Effacement (%): Thick Exam by:: Kris Mouton, MD   Vertex Confirmed by bedside Ultrasound  Prenatal labs: ABO, Rh: --/--/O POS (01/06 1801) Antibody: NEG (01/06 1801) Rubella: 23.40 (07/22 1049) RPR: Non Reactive (11/21 1022)  HBsAg: Negative (07/22 1049)  HIV: Non Reactive (11/21 1022)  GC/Chlamydia: Not  tested GBS:  Unknown  1hour/fasting/2-hr GTT: 178/87/158 Genetic screening:  negative Anatomy US: normal  Prenatal Transfer Tool  Maternal Diabetes: Yes:  Diabetes Type:  Diet controlled Genetic Screening: Normal Maternal Ultrasounds/Referrals: Normal Fetal Ultrasounds or other Referrals:  None Maternal Substance Abuse:  No Significant Maternal Medications:  None Significant Maternal Lab Results: Lab values include: Other: elevated 2 hours GTT.  Results for orders placed or performed during the hospital encounter of 04/06/18 (from the past 24 hour(s))  Protein / creatinine ratio, urine   Collection Time: 04/06/18  4:58 PM  Result Value Ref Range   Creatinine, Urine 19.00 mg/dL   Total Protein, Urine 6 mg/dL   Protein Creatinine Ratio 0.32 (H) 0.00 - 0.15 mg/mg[Cre]  Comprehensive metabolic panel   Collection Time: 04/06/18  6:01 PM  Result Value Ref Range   Sodium 134 (L) 135 - 145 mmol/L   Potassium 3.3 (L) 3.5 - 5.1 mmol/L   Chloride 105 98 - 111 mmol/L   CO2 23 22 - 32 mmol/L   Glucose, Bld 98 70 - 99 mg/dL   BUN <5 (L) 6 - 20 mg/dL   Creatinine, Ser 0.57 0.44 - 1.00 mg/dL   Calcium 8.9 8.9 - 10.3 mg/dL   Total Protein 6.7 6.5 - 8.1 g/dL   Albumin 2.7 (L) 3.5 - 5.0 g/dL   AST 16 15 - 41 U/L   ALT 14 0 - 44 U/L   Alkaline Phosphatase 100 38 - 126 U/L   Total Bilirubin 0.5 0.3 - 1.2 mg/dL   GFR calc non Af Amer >60 >60 mL/min   GFR calc Af Amer >60 >60 mL/min   Anion gap 6 5 - 15  CBC   Collection Time: 04/06/18  6:01 PM  Result Value Ref Range   WBC 6.6 4.0 - 10.5 K/uL   RBC 3.80 (L) 3.87 - 5.11 MIL/uL   Hemoglobin 11.5 (L) 12.0 - 15.0 g/dL   HCT 34.5 (L) 36.0 - 46.0 %   MCV 90.8 80.0 - 100.0 fL   MCH 30.3 26.0 - 34.0 pg   MCHC 33.3 30.0 - 36.0 g/dL   RDW 13.6 11.5 - 15.5 %   Platelets 308 150 - 400 K/uL   nRBC 0.0 0.0 - 0.2 %  Type and screen [place]   Collection Time: 04/06/18  6:01 PM  Result Value Ref Range   ABO/RH(D) O POS     Antibody Screen NEG    Sample Expiration      04/09/2018 Performed at [facility], [address] [place], [place] [address]     Patient Active  Problem List   Diagnosis Date Noted  . Preeclampsia, severe, third trimester 04/06/2018  . Abnormal glucose tolerance test (GTT) during pregnancy, antepartum 02/25/2018  . Gestational diabetes 02/20/2018  . Prediabetes 11/17/2017  . Encounter for supervision of normal first pregnancy in first trimester 10/20/2017    Assessment: Michelle Taylor is a 28 y.o. G1P0 at 16w0dhere for IOL for Pre-Eclampsia with severe features by BP. Received one dose of procardia in MAU. Patient is at 35w0, will give dose of betamethasone. Pre-term so will give PCN ppx as gbs status is unknown. P/C ration 0.32, so will start magnesium. Patient closed so will give vaginal cytotec to start. Foley bulb when appropriate. Blood sugar checks, will start GCoolif necessary.  #Labor: vaginal cytotec #Pain: IV narcotics, open to epidural #FWB: Cat 1 #ID:  GBS unknown- PCN #MOF: breast #MOC: OCPs #Circ:  N/A  JGuadalupe DawnMD PGY-2 Family Medicine Resident 04/06/2018, 11:12 PM   Attestation: I have seen this patient and agree with the resident's documentation. I have examined them separately, and we have discussed the plan of care.   LLambert Mody WJuleen China DO OB/GYN Fellow

## 2018-04-06 NOTE — MAU Provider Note (Addendum)
History    Chief Complaint  Patient presents with  . Hypertension   Michelle Taylor is a 28 y.o. G1P0 at 36w0dwith hx of diet controlled gestational diabetes who presents for pre-eclampsia evaluation due to elevated BP in clinic today (155/104, 153/100). She has not prior hx of HTN or elevated BP's. Denies HA, visual changes, RUQ pain, changes in LE edema, or SOB. She denies any regular contractions/cramping, vaginal discharge of bleeding, and feels good fetal movement.     OB History    Gravida  1   Para      Term      Preterm      AB      Living        SAB      TAB      Ectopic      Multiple      Live Births              Past Medical History:  Diagnosis Date  . Chlamydia 2008  . Gestational diabetes   . Yeast infection     Past Surgical History:  Procedure Laterality Date  . PILONIDAL CYST EXCISION    . WISDOM TOOTH EXTRACTION      Family History  Problem Relation Age of Onset  . Hypertension Paternal Grandmother   . Hypertension Paternal Grandfather     Social History   Tobacco Use  . Smoking status: Former Smoker    Packs/day: 0.25    Types: Cigarettes  . Smokeless tobacco: Never Used  Substance Use Topics  . Alcohol use: Not Currently  . Drug use: No    Allergies: No Known Allergies  Medications Prior to Admission  Medication Sig Dispense Refill Last Dose  . glucose blood test strip Use as instructed to monitor blood glucose four times daily. ICD O24.419 200 each 12 Taking  . glucose monitoring kit (FREESTYLE) monitoring kit Use as instructed to monitor blood glucose four times daily. ICD O24.419 1 each 0 Taking  . Lancets 28G MISC Use as instructed to monitor blood glucose four times daily. ICD O24.419 200 each 12 Taking  . Prenat-Fe Poly-Methfol-FA-DHA (VITAFOL ULTRA) 29-0.6-0.4-200 MG CAPS Take 1 capsule by mouth daily. 30 capsule 11 Taking    Review of Systems  Eyes: Negative for blurred vision and double vision.   Respiratory: Negative.   Cardiovascular: Positive for leg swelling. Negative for chest pain and palpitations.  Gastrointestinal: Negative for abdominal pain, nausea and vomiting.  Genitourinary: Negative for dysuria and hematuria.  Skin: Negative.   Neurological: Negative for dizziness, sensory change, focal weakness, weakness and headaches.   Physical Exam Blood pressure (!) 160/91, pulse 79, temperature 98.4 F (36.9 C), temperature source Oral, resp. rate 17, height 5' (1.524 m), weight 84.5 kg, last menstrual period 08/04/2017, SpO2 99 %. Physical Exam  Constitutional: She is oriented to person, place, and time. She appears well-nourished. No distress.  Eyes: Pupils are equal, round, and reactive to light. No scleral icterus.  Cardiovascular: Normal rate, regular rhythm and normal heart sounds.  Respiratory: Effort normal and breath sounds normal.  GI: Soft. There is no abdominal tenderness. There is no guarding.  Musculoskeletal:        General: Edema present. No tenderness.     Comments: 1+ bilateral lower extremity edema, no erythema or tenderness  Neurological: She is oriented to person, place, and time.  Skin: She is not diaphoretic.    MAU Course Procedures  MDM - CBC remarkable for mildly  low (11.5), platelets normal (308), WBC normal (6.6) - CMP remarkable for mild hyponatremia (134) and hypokalemia (3.3) - Serial BP measurements  - Protein creatinine ratio elevated, 0.32 - Pre-eclampsia evaluation: Multiple BP's >140/90 with two  in severe range (160/91 and 161/94). Protein creatinine ratio elevated at 0.32 meeting criteria for Pre-Eclampsia. Will discuss further with Dr. Rosana Hoes for appropriate management. Treated with PO Procardia.   [address], MS-3  04/06/18    OB FELLOW MAU DISCHARGE ATTESTATION  I have seen and examined this patient; I agree with above documentation in the student's note and edited as appropriate.   7:59 PM  Care signed over to  [facility], CNM due to change of shift.    Phill Myron, D.O. OB Fellow  04/06/2018, 8:00 PM   *Consult with Dr. Rosana Hoes @ 2125 - notified of patient's complaints, assessments, lab & NST results, recommended tx plan admit for IOL of PEC with Severe features  Assessment and Plan Preeclampsia, severe, third trimester - Admit to L&D for IOL  - Routine admission orders - Dr. Gerri Lins assumes care of patient upon admission to L&D  Laury Deep, [place] 04/06/2018 10:01 PM

## 2018-04-06 NOTE — Progress Notes (Signed)
Patient reports fetal movement and occasional contractions.

## 2018-04-06 NOTE — Progress Notes (Signed)
   PRENATAL VISIT NOTE  Subjective:  Michelle Taylor is a 28 y.o. G1P0 at [redacted]w[redacted]d being seen today for ongoing prenatal care.  She is currently monitored for the following issues for this high-risk pregnancy and has Encounter for supervision of normal first pregnancy in first trimester; Prediabetes; Gestational diabetes; and Abnormal glucose tolerance test (GTT) during pregnancy, antepartum on their problem list.  Patient reports no complaints.  Contractions: Irregular. Vag. Bleeding: None.  Movement: Present. Denies leaking of fluid.   The following portions of the patient's history were reviewed and updated as appropriate: allergies, current medications, past family history, past medical history, past social history, past surgical history and problem list. Problem list updated.  Objective:   Vitals:   04/06/18 1619 04/06/18 1625  BP: (!) 155/104 (!) 153/100  Pulse: 84 85  Weight: 189 lb 11.2 oz (86 kg)     Fetal Status: Fetal Heart Rate (bpm): 140 Fundal Height: 35 cm Movement: Present     General:  Alert, oriented and cooperative. Patient is in no acute distress.  Skin: Skin is warm and dry. No rash noted.   Cardiovascular: Normal heart rate noted  Respiratory: Normal respiratory effort, no problems with respiration noted  Abdomen: Soft, gravid, appropriate for gestational age.  Pain/Pressure: Absent     Pelvic: Cervical exam deferred        Extremities: Normal range of motion.  Edema: Trace  Mental Status: Normal mood and affect. Normal behavior. Normal judgment and thought content.   Assessment and Plan:  Pregnancy: G1P0 at [redacted]w[redacted]d  1. Encounter for supervision of normal first pregnancy in first trimester Patient is doing well without She denies any HA, visual changes, RUQ/epigastric pain, nausea or emesis Patient with elevated BP in office. Patient sent to MAU for further evaluation  2. Diet controlled gestational diabetes mellitus (GDM) in third trimester CBGs reviewed and  great majority within range Continue diet control Patient has follow up growth ultrasound scheduled if not delivered   Preterm labor symptoms and general obstetric precautions including but not limited to vaginal bleeding, contractions, leaking of fluid and fetal movement were reviewed in detail with the patient. Please refer to After Visit Summary for other counseling recommendations.  Return in about 1 week (around 04/13/2018) for ROB.  Future Appointments  Date Time Provider Department Center  04/15/2018  9:45 AM Urbano Milhouse, Gigi Gin, MD [facility] None  04/20/2018  8:45 AM [facility] [place] 2 [facility]    Catalina Antigua, MD

## 2018-04-06 NOTE — MAU Note (Signed)
Urine in lab 

## 2018-04-06 NOTE — MAU Note (Signed)
Sent over for further eval.  No hx of elevated BP.  1st preg. Denies HA, visual changes, epigastric pain, no change in swelling.

## 2018-04-07 ENCOUNTER — Inpatient Hospital Stay (HOSPITAL_COMMUNITY): Payer: BLUE CROSS/BLUE SHIELD | Admitting: Anesthesiology

## 2018-04-07 ENCOUNTER — Encounter (HOSPITAL_COMMUNITY): Payer: Self-pay | Admitting: Neonatal-Perinatal Medicine

## 2018-04-07 DIAGNOSIS — Z3A35 35 weeks gestation of pregnancy: Secondary | ICD-10-CM

## 2018-04-07 DIAGNOSIS — O24420 Gestational diabetes mellitus in childbirth, diet controlled: Secondary | ICD-10-CM

## 2018-04-07 DIAGNOSIS — O1414 Severe pre-eclampsia complicating childbirth: Secondary | ICD-10-CM

## 2018-04-07 LAB — CBC
HCT: 36.6 % (ref 36.0–46.0)
HCT: 35.6 % — ABNORMAL LOW (ref 36.0–46.0)
Hemoglobin: 12.2 g/dL (ref 12.0–15.0)
Hemoglobin: 11.8 g/dL — ABNORMAL LOW (ref 12.0–15.0)
MCH: 30.3 pg (ref 26.0–34.0)
MCH: 30.4 pg (ref 26.0–34.0)
MCHC: 33.3 g/dL (ref 30.0–36.0)
MCHC: 33.1 g/dL (ref 30.0–36.0)
MCV: 90.8 fL (ref 80.0–100.0)
MCV: 91.8 fL (ref 80.0–100.0)
Platelets: 324 10*3/uL (ref 150–400)
Platelets: 317 10*3/uL (ref 150–400)
RBC: 4.03 MIL/uL (ref 3.87–5.11)
RBC: 3.88 MIL/uL (ref 3.87–5.11)
RDW: 13.7 % (ref 11.5–15.5)
RDW: 13.8 % (ref 11.5–15.5)
WBC: 9.5 10*3/uL (ref 4.0–10.5)
WBC: 14.6 10*3/uL — ABNORMAL HIGH (ref 4.0–10.5)
nRBC: 0 % (ref 0.0–0.2)
nRBC: 0 % (ref 0.0–0.2)

## 2018-04-07 LAB — RPR: RPR: NONREACTIVE

## 2018-04-07 LAB — GLUCOSE, CAPILLARY
GLUCOSE-CAPILLARY: 98 mg/dL (ref 70–99)
GLUCOSE-CAPILLARY: 114 mg/dL — AB (ref 70–99)
Glucose-Capillary: 92 mg/dL (ref 70–99)
Glucose-Capillary: 97 mg/dL (ref 70–99)

## 2018-04-07 LAB — ABO/RH: ABO/RH(D): O POS

## 2018-04-07 MED ORDER — POTASSIUM CHLORIDE CRYS ER 20 MEQ PO TBCR
40.0000 meq | EXTENDED_RELEASE_TABLET | Freq: Once | ORAL | Status: AC
  Administered 2018-04-07: 40 meq via ORAL
  Filled 2018-04-07: qty 2

## 2018-04-07 MED ORDER — LIDOCAINE HCL (PF) 1 % IJ SOLN
INTRAMUSCULAR | Status: DC | PRN
  Administered 2018-04-07: 2 mL via EPIDURAL
  Administered 2018-04-07: 5 mL via EPIDURAL
  Administered 2018-04-07: 3 mL via EPIDURAL

## 2018-04-07 MED ORDER — BETAMETHASONE SOD PHOS & ACET 6 (3-3) MG/ML IJ SUSP
12.0000 mg | Freq: Once | INTRAMUSCULAR | Status: DC
  Filled 2018-04-07: qty 2

## 2018-04-07 MED ORDER — PHENYLEPHRINE 40 MCG/ML (10ML) SYRINGE FOR IV PUSH (FOR BLOOD PRESSURE SUPPORT)
80.0000 ug | PREFILLED_SYRINGE | INTRAVENOUS | Status: DC | PRN
  Filled 2018-04-07: qty 10

## 2018-04-07 MED ORDER — EPHEDRINE 5 MG/ML INJ
10.0000 mg | INTRAVENOUS | Status: DC | PRN
  Filled 2018-04-07: qty 2

## 2018-04-07 MED ORDER — OXYTOCIN 40 UNITS IN NORMAL SALINE INFUSION - SIMPLE MED
2.5000 [IU]/h | INTRAVENOUS | Status: DC
  Administered 2018-04-07: 2.5 [IU]/h via INTRAVENOUS
  Filled 2018-04-07: qty 1000

## 2018-04-07 MED ORDER — LACTATED RINGERS IV SOLN: 500.0000 mL | Freq: Once | INTRAVENOUS | Status: DC

## 2018-04-07 MED ORDER — LABETALOL HCL 5 MG/ML IV SOLN: 80.0000 mg | INTRAVENOUS | Status: DC | PRN

## 2018-04-07 MED ORDER — TERBUTALINE SULFATE 1 MG/ML IJ SOLN: 0.2500 mg | Freq: Once | INTRAMUSCULAR | Status: DC | PRN

## 2018-04-07 MED ORDER — LIDOCAINE HCL (PF) 1 % IJ SOLN
30.0000 mL | Freq: Once | INTRAMUSCULAR | Status: AC
  Administered 2018-04-07: 30 mL

## 2018-04-07 MED ORDER — FENTANYL 2.5 MCG/ML BUPIVACAINE 1/10 % EPIDURAL INFUSION (WH - ANES)
14.0000 mL/h | INTRAMUSCULAR | Status: DC | PRN
  Administered 2018-04-07: 12 mL/h via EPIDURAL
  Filled 2018-04-07: qty 100

## 2018-04-07 MED ORDER — DIPHENHYDRAMINE HCL 50 MG/ML IJ SOLN: 12.5000 mg | INTRAMUSCULAR | Status: DC | PRN

## 2018-04-07 MED ORDER — OXYTOCIN 40 UNITS IN NORMAL SALINE INFUSION - SIMPLE MED
1.0000 m[IU]/min | INTRAVENOUS | Status: DC
  Administered 2018-04-07: 2 m[IU]/min via INTRAVENOUS
  Filled 2018-04-07: qty 1000

## 2018-04-07 MED ORDER — LABETALOL HCL 5 MG/ML IV SOLN: 40.0000 mg | INTRAVENOUS | Status: DC | PRN

## 2018-04-07 MED ORDER — LACTATED RINGERS IV SOLN
500.0000 mL | Freq: Once | INTRAVENOUS | Status: AC
  Administered 2018-04-07: 14:00:00 via INTRAVENOUS

## 2018-04-07 MED ORDER — LABETALOL HCL 100 MG PO TABS
100.0000 mg | ORAL_TABLET | Freq: Two times a day (BID) | ORAL | Status: DC
  Administered 2018-04-07: 100 mg via ORAL
  Filled 2018-04-07: qty 1

## 2018-04-07 MED ORDER — LIDOCAINE HCL (PF) 1 % IJ SOLN
INTRAMUSCULAR | Status: AC
  Administered 2018-04-07: 30 mL
  Filled 2018-04-07: qty 30

## 2018-04-07 MED ORDER — OXYTOCIN BOLUS FROM INFUSION
500.0000 mL | Freq: Once | INTRAVENOUS | Status: AC
  Administered 2018-04-07: 500 mL via INTRAVENOUS

## 2018-04-07 MED ORDER — LABETALOL HCL 5 MG/ML IV SOLN: 20.0000 mg | INTRAVENOUS | Status: DC | PRN

## 2018-04-07 MED ORDER — HYDRALAZINE HCL 20 MG/ML IJ SOLN: 10.0000 mg | INTRAMUSCULAR | Status: DC | PRN

## 2018-04-07 MED ORDER — BETAMETHASONE SOD PHOS & ACET 6 (3-3) MG/ML IJ SUSP
12.0000 mg | Freq: Once | INTRAMUSCULAR | Status: AC
  Administered 2018-04-07: 12 mg via INTRAMUSCULAR
  Filled 2018-04-07: qty 2

## 2018-04-07 MED ORDER — PHENYLEPHRINE 40 MCG/ML (10ML) SYRINGE FOR IV PUSH (FOR BLOOD PRESSURE SUPPORT)
80.0000 ug | PREFILLED_SYRINGE | INTRAVENOUS | Status: DC | PRN
  Filled 2018-04-07 (×2): qty 10

## 2018-04-07 NOTE — Anesthesia Pain Management Evaluation Note (Signed)
  CRNA Pain Management Visit Note  Patient: Michelle Taylor, 28 y.o., female  "Hello I am a member of the anesthesia team at [facility]. We have an anesthesia team available at all times to provide care throughout the hospital, including epidural management and anesthesia for C-section. I don't know your plan for the delivery whether it a natural birth, water birth, IV sedation, nitrous supplementation, doula or epidural, but we want to meet your pain goals."   1.Was your pain managed to your expectations on prior hospitalizations?   No prior hospitalizations  2.What is your expectation for pain management during this hospitalization?     Epidural  3.How can we help you reach that goal? epidural  Record the patient's initial score and the patient's pain goal.   Pain: 1  Pain Goal: 5 [facility] wants you to be able to say your pain was always managed very well.  Fiore Detjen 04/07/2018

## 2018-04-07 NOTE — Anesthesia Preprocedure Evaluation (Signed)
Anesthesia Evaluation  Patient identified by MRN, date of birth, ID band Patient awake    Reviewed: Allergy & Precautions, NPO status , Patient's Chart, lab work & pertinent test results  Airway Mallampati: II  TM Distance: >3 FB Neck ROM: Full    Dental  (+) Teeth Intact, Dental Advisory Given   Pulmonary former smoker,    Pulmonary exam normal breath sounds clear to auscultation       Cardiovascular negative cardio ROS Normal cardiovascular exam Rhythm:Regular Rate:Normal     Neuro/Psych negative neurological ROS     GI/Hepatic negative GI ROS, Neg liver ROS,   Endo/Other  diabetes, Gestational  Renal/GU negative Renal ROS     Musculoskeletal negative musculoskeletal ROS (+)   Abdominal   Peds  Hematology negative hematology ROS (+) Plt 324k   Anesthesia Other Findings Day of surgery medications reviewed with the patient.  Reproductive/Obstetrics (+) Pregnancy Pre-eclampsia                              Anesthesia Physical Anesthesia Plan  ASA: III  Anesthesia Plan: Epidural   Post-op Pain Management:    Induction:   PONV Risk Score and Plan: 2 and Treatment may vary due to age or medical condition  Airway Management Planned: Natural Airway  Additional Equipment:   Intra-op Plan:   Post-operative Plan:   Informed Consent: I have reviewed the patients History and Physical, chart, labs and discussed the procedure including the risks, benefits and alternatives for the proposed anesthesia with the patient or authorized representative who has indicated his/her understanding and acceptance.   Dental advisory given  Plan Discussed with:   Anesthesia Plan Comments: (Patient identified. Risks/Benefits/Options discussed with patient including but not limited to bleeding, infection, nerve damage, paralysis, failed block, incomplete pain control, headache, blood pressure changes,  nausea, vomiting, reactions to medication both or allergic, itching and postpartum back pain. Confirmed with bedside nurse the patient's most recent platelet count. Confirmed with patient that they are not currently taking any anticoagulation, have any bleeding history or any family history of bleeding disorders. Patient expressed understanding and wished to proceed. All questions were answered. )        Anesthesia Quick Evaluation

## 2018-04-07 NOTE — Progress Notes (Signed)
OB/GYN Faculty Practice: Labor Progress Note  Subjective: Doing well, has been able to get some rest.   Objective: BP 138/82   Pulse 87   Temp 98.3 F (36.8 C) (Oral)   Resp 17   Ht 5' (1.524 m)   Wt 84.5 kg   LMP 08/04/2017   SpO2 100%   BMI 36.38 kg/m  Gen: well-appearing, NAD Dilation: 1 Effacement (%): Thick Cervical Position: Posterior Station: Ballotable Presentation: Vertex Exam by:: Dr Earlene PlaterWallace  Assessment and Plan: 28 y.o. G1P0 8248w1d here with IOL for severe preeclampsia.   Labor: FB placed without complication, inflated with 60cc fluid. 3rd dose of vaginal cytotec placed.  -- pain control: plans for epidural -- PPH Risk: medium (Mg++)  Fetal Well-Being: EFW 6lbs by Leopold's. Cephalic by sutures.  -- Category I - continuous fetal monitoring -- GBS unknown - getting PCN, culture pending  -- will give BMZ every 24 hours x 2   Preeclampsia with Severe Features: Remains asymptomatic. Moderate range Bps.  -- continue Mg++  A1GDM: BG stable, will continue to check every 4 hours.   Cristal DeerLaurel S. Earlene PlaterWallace, DO OB/GYN Fellow, Faculty Practice  7:51 AM

## 2018-04-07 NOTE — Anesthesia Procedure Notes (Signed)
Epidural Patient location during procedure: OB Start time: 04/07/2018 1:51 PM End time: 04/07/2018 1:57 PM  Staffing Anesthesiologist: Cecile Hearing, MD Performed: anesthesiologist   Preanesthetic Checklist Completed: patient identified, pre-op evaluation, timeout performed, IV checked, risks and benefits discussed and monitors and equipment checked  Epidural Patient position: sitting Prep: DuraPrep Patient monitoring: blood pressure and continuous pulse ox Approach: midline Location: L3-L4 Injection technique: LOR air  Needle:  Needle type: Tuohy  Needle gauge: 17 G Needle length: 9 cm Needle insertion depth: 6 cm Catheter size: 19 Gauge Catheter at skin depth: 11 cm Test dose: negative and Other (1% Lidocaine)  Additional Notes Patient identified.  Risk benefits discussed including failed block, incomplete pain control, headache, nerve damage, paralysis, blood pressure changes, nausea, vomiting, reactions to medication both toxic or allergic, and postpartum back pain.  Patient expressed understanding and wished to proceed.  All questions were answered.  Sterile technique used throughout procedure and epidural site dressed with sterile barrier dressing. No paresthesia or other complications noted. The patient did not experience any signs of intravascular injection such as tinnitus or metallic taste in mouth nor signs of intrathecal spread such as rapid motor block. Please see nursing notes for vital signs. Reason for block:procedure for pain

## 2018-04-07 NOTE — Progress Notes (Signed)
LABOR PROGRESS NOTE  Michelle Taylor is a 28 y.o. G1P0 at [redacted]w[redacted]d  admitted for IOL for severe Pre-E.   Subjective: Patient more comfortable with epidural but still feeling pain with contractions.   Objective: BP (!) 137/93   Pulse (!) 104   Temp 98.6 F (37 C) (Axillary)   Resp 18   Ht 5' (1.524 m)   Wt 84.5 kg   LMP 08/04/2017   SpO2 99%   BMI 36.38 kg/m  or  Vitals:   04/07/18 1540 04/07/18 1601 04/07/18 1700 04/07/18 1730  BP:  (!) 144/91 (!) 149/94 (!) 137/93  Pulse:  (!) 101 (!) 108 (!) 104  Resp:  18 18 18   Temp:  98.6 F (37 C)    TempSrc:  Axillary    SpO2: 97% 99% 99% 99%  Weight:      Height:        Dilation: 5 Effacement (%): 70 Cervical Position: Middle Station: -1, 0 Presentation: Vertex Exam by:: dr Sergio Zawislak FHT: baseline rate 130, moderate varibility, +acel, variable and early decels Toco: q5-8 min   Labs: Lab Results  Component Value Date   WBC 9.5 04/07/2018   HGB 12.2 04/07/2018   HCT 36.6 04/07/2018   MCV 90.8 04/07/2018   PLT 324 04/07/2018    Patient Active Problem List   Diagnosis Date Noted  . Preeclampsia, severe, third trimester 04/06/2018  . Abnormal glucose tolerance test (GTT) during pregnancy, antepartum 02/25/2018  . Gestational diabetes 02/20/2018  . Prediabetes 11/17/2017  . Encounter for supervision of normal first pregnancy in first trimester 10/20/2017    Assessment / Plan: 28 y.o. G1P0 at [redacted]w[redacted]d here for IOL for severe Pre-E.   Severe Pre-E: Patient asymptomatic. Have started Labetalol 100 mg BID for persistent moderate range pressures. Continue to monitor BPs and continue Mg++.  A1GDM: Most recent CBG 97. Continue to monitor q4h during latent labor.   Labor: S/p cytotec x3 and FB. Pitocin discontinued for prolonged decel. FHT now Cat II but overall reassuring. Resume Pitocin 2x2. FSE and IUPC in place.  Fetal Wellbeing:  Cat II. FHT with good variability and good return to baseline.  Pain Control:  Epidural in  place.  Anticipated MOD:  NSVD   Marcy Siren, D.O. OB Fellow  04/07/2018, 5:38 PM

## 2018-04-07 NOTE — Progress Notes (Signed)
LABOR PROGRESS NOTE  Michelle Taylor is a 28 y.o. G1P0 at [redacted]w[redacted]d  admitted for IOL for severe Pre-E.   Subjective: Patient called out, concerned had SROM around FB. Feeling uncomfortable and would like epidural.   Objective: BP (!) 147/91   Pulse 99   Temp 98.1 F (36.7 C) (Oral)   Resp 18   Ht 5' (1.524 m)   Wt 84.5 kg   LMP 08/04/2017   SpO2 99%   BMI 36.38 kg/m  or  Vitals:   04/07/18 1001 04/07/18 1052 04/07/18 1100 04/07/18 1200  BP: (!) 141/87 138/81  (!) 147/91  Pulse: 100 (!) 102 97 99  Resp: 18 18 18 18   Temp: 98.1 F (36.7 C)     TempSrc: Oral     SpO2:   98% 99%  Weight:      Height:        Dilation: 4.5 Effacement (%): 70 Cervical Position: Posterior Station: -2 Presentation: Vertex Exam by:: dr wallace FHT: baseline rate 145, moderate varibility, +acel, variable and late decels Toco: q2-5 min   Labs: Lab Results  Component Value Date   WBC 6.6 04/06/2018   HGB 11.5 (L) 04/06/2018   HCT 34.5 (L) 04/06/2018   MCV 90.8 04/06/2018   PLT 308 04/06/2018    Patient Active Problem List   Diagnosis Date Noted  . Preeclampsia, severe, third trimester 04/06/2018  . Abnormal glucose tolerance test (GTT) during pregnancy, antepartum 02/25/2018  . Gestational diabetes 02/20/2018  . Prediabetes 11/17/2017  . Encounter for supervision of normal first pregnancy in first trimester 10/20/2017    Assessment / Plan: 28 y.o. G1P0 at [redacted]w[redacted]d here for IOL for severe Pre-E.   Severe Pre-E: Patient asymptomatic. Have started Labetalol 100 mg BID for persistent moderate range pressures. Continue to monitor BPs and continue Mg++.  A1GDM: Most recent CBG 114. Continue to monitor q4h during latent labor.   Labor: FB in vagina on exam, removed. S/p cytotec x3. Will plan to start Pitocin after Epidural placed. No membranes felt on cervical exam, so suspect SROM.  Fetal Wellbeing:  Cat II. FHT with good variability and good return to baseline. Will give IVF bolus now. Deep  variable to 60 bpm noted, this was immediately prior to patient calling out for concern for ROM. No membranes felt on exam so suspect related to SROM.   Pain Control:  Plan for epidural placement now.  Anticipated MOD:  NSVD   Marcy Siren, D.O. OB Fellow  04/07/2018, 12:47 PM

## 2018-04-07 NOTE — Progress Notes (Signed)
OB/GYN Faculty Practice: Labor Progress Note  Subjective: Doing well, no specific complaints. Is feeling regular cramping. No headaches, vision changes.  Objective: BP (!) 154/98   Pulse 94   Temp 98.5 F (36.9 C) (Oral)   Resp 16   Ht 5' (1.524 m)   Wt 84.5 kg   LMP 08/04/2017   SpO2 100%   BMI 36.38 kg/m  Gen: well-appearing, NAD Dilation: Fingertip Effacement (%): Thick Cervical Position: Posterior Station: Ballotable Presentation: Vertex Exam by:: Earlene Plater, DO  Assessment and Plan: 28 y.o. G1P0 [redacted]w[redacted]d here with IOL for severe preeclampsia.   Labor: Induction started with 1 dose of cytotec when closed cervix. Now 1-2 cm external os which funnels to finger tip. Will give additional dose of cytotec then attempt FB at next check. -- pain control: plans for epidural -- PPH Risk: medium (Mg++)  Fetal Well-Being: EFW 6lbs by Leopold's. Cephalic by sutures.  -- Category I - continuous fetal monitoring -- GBS unknown - getting PCN, culture pending  -- will give BMZ every 24 hours x 2   Preeclampsia with Severe Features: Elevated BP, has not received any anti-hypertensives. Asymptomatic at this time. UPC 0.32. HELLP labs wnl. Adequate UOP. -- continue Mg++  A1GDM: BG stable, will continue to check every 4 hours.   Cristal Deer. Earlene Plater, DO OB/GYN Fellow, Faculty Practice  3:50 AM

## 2018-04-08 ENCOUNTER — Telehealth: Payer: Self-pay | Admitting: Obstetrics & Gynecology

## 2018-04-08 MED ORDER — WITCH HAZEL-GLYCERIN EX PADS: 1.0000 "application " | MEDICATED_PAD | CUTANEOUS | Status: DC | PRN

## 2018-04-08 MED ORDER — PRENATAL MULTIVITAMIN CH
1.0000 | ORAL_TABLET | Freq: Every day | ORAL | Status: DC
  Administered 2018-04-08 – 2018-04-09 (×2): 1 via ORAL
  Filled 2018-04-08 (×2): qty 1

## 2018-04-08 MED ORDER — ONDANSETRON HCL 4 MG/2ML IJ SOLN: 4.0000 mg | INTRAMUSCULAR | Status: DC | PRN

## 2018-04-08 MED ORDER — SIMETHICONE 80 MG PO CHEW: 80.0000 mg | CHEWABLE_TABLET | ORAL | Status: DC | PRN

## 2018-04-08 MED ORDER — ZOLPIDEM TARTRATE 5 MG PO TABS: 5.0000 mg | ORAL_TABLET | Freq: Every evening | ORAL | Status: DC | PRN

## 2018-04-08 MED ORDER — DIBUCAINE 1 % RE OINT: 1.0000 "application " | TOPICAL_OINTMENT | RECTAL | Status: DC | PRN

## 2018-04-08 MED ORDER — ENALAPRIL MALEATE 5 MG PO TABS
5.0000 mg | ORAL_TABLET | Freq: Every day | ORAL | Status: DC
  Administered 2018-04-08 – 2018-04-09 (×2): 5 mg via ORAL
  Filled 2018-04-08 (×3): qty 1

## 2018-04-08 MED ORDER — ACETAMINOPHEN 325 MG PO TABS: 650.0000 mg | ORAL_TABLET | ORAL | Status: DC | PRN

## 2018-04-08 MED ORDER — BENZOCAINE-MENTHOL 20-0.5 % EX AERO
1.0000 "application " | INHALATION_SPRAY | CUTANEOUS | Status: DC | PRN
  Administered 2018-04-08: 1 via TOPICAL
  Filled 2018-04-08: qty 56

## 2018-04-08 MED ORDER — ONDANSETRON HCL 4 MG PO TABS: 4.0000 mg | ORAL_TABLET | ORAL | Status: DC | PRN

## 2018-04-08 MED ORDER — DIPHENHYDRAMINE HCL 25 MG PO CAPS: 25.0000 mg | ORAL_CAPSULE | Freq: Four times a day (QID) | ORAL | Status: DC | PRN

## 2018-04-08 MED ORDER — TETANUS-DIPHTH-ACELL PERTUSSIS 5-2.5-18.5 LF-MCG/0.5 IM SUSP: 0.5000 mL | Freq: Once | INTRAMUSCULAR | Status: DC

## 2018-04-08 MED ORDER — IBUPROFEN 600 MG PO TABS
600.0000 mg | ORAL_TABLET | Freq: Four times a day (QID) | ORAL | Status: DC
  Administered 2018-04-08 – 2018-04-09 (×7): 600 mg via ORAL
  Filled 2018-04-08 (×7): qty 1

## 2018-04-08 MED ORDER — COCONUT OIL OIL
1.0000 "application " | TOPICAL_OIL | Status: DC | PRN
  Administered 2018-04-08: 1 via TOPICAL
  Filled 2018-04-08: qty 120

## 2018-04-08 MED ORDER — LACTATED RINGERS IV SOLN
INTRAVENOUS | Status: DC
  Administered 2018-04-08: 04:00:00 via INTRAVENOUS

## 2018-04-08 MED ORDER — SENNOSIDES-DOCUSATE SODIUM 8.6-50 MG PO TABS
2.0000 | ORAL_TABLET | ORAL | Status: DC
  Administered 2018-04-08 (×2): 2 via ORAL
  Filled 2018-04-08 (×2): qty 2

## 2018-04-08 NOTE — Anesthesia Postprocedure Evaluation (Signed)
Anesthesia Post Note  Patient: Carmie KannerBrittany Bushong  Procedure(s) Performed: AN AD HOC LABOR EPIDURAL     Patient location during evaluation: Mother Baby Anesthesia Type: Epidural Level of consciousness: awake Pain management: pain level controlled Vital Signs Assessment: post-procedure vital signs reviewed and stable Respiratory status: spontaneous breathing Cardiovascular status: stable Postop Assessment: patient able to bend at knees, epidural receding, no headache and no backache Anesthetic complications: no    Last Vitals:  Vitals:   04/08/18 0600 04/08/18 0700  BP:    Pulse:    Resp: 18 16  Temp:    SpO2: 98% 96%    Last Pain:  Vitals:   04/08/18 0755  TempSrc:   PainSc: 0-No pain   Pain Goal: Patients Stated Pain Goal: 3 (04/08/18 0414)               Edison PaceWILKERSON,Kaydenn Mclear

## 2018-04-08 NOTE — Lactation Note (Addendum)
This note was copied from a baby's chart. Lactation Consultation Note  Patient Name: Michelle Taylor Today's Date: 04/08/2018   Hand expression was taught to Mom, but there was no yield noted. Mom does report + breast changes w/pregnancy, especially an increase in cup size. Nipple anatomy suggests previous nipple piercings, which Mom affirms.   Mom has WIC. I discussed the [facility] loaner program with her, in which she may be interested. Mom has the number for lactation to call if she does decide to proceed.   Mom is taking enalapril 5mg  (L2).   Lurline Hare [facility] 04/08/2018, 2:24 PM

## 2018-04-08 NOTE — Progress Notes (Signed)
Post Partum Day 1 Subjective: no complaints, up ad lib, voiding and tolerating PO  Objective: Blood pressure (!) 131/93, pulse 100, temperature 98.2 F (36.8 C), temperature source Oral, resp. rate 16, height 5' (1.524 m), weight 84.5 kg, last menstrual period 08/04/2017, SpO2 96 %, unknown if currently breastfeeding. good UOP  Physical Exam:  General: alert, cooperative and appears stated age Lochia: appropriate Uterine Fundus: firm DVT Evaluation: No evidence of DVT seen on physical exam.  Recent Labs    04/07/18 1301 04/07/18 2148  HGB 12.2 11.8*  HCT 36.6 35.6*    Assessment/Plan: Plan for discharge tomorrow and Breastfeeding  Add Vasotec for BP control Continue Magnesium x 24 hour pp   LOS: 2 days   Reva Bores 04/08/2018, 10:13 AM

## 2018-04-08 NOTE — Lactation Note (Signed)
This note was copied from a baby's chart. Lactation Consultation Note  Patient Name: Girl Delita Dabdoub Today's Date: 04/08/2018   Initial visit attempted at 13 hours of life. Mom has been set up with a DEBP & has used it at least once. Mom is eating breakfast, but would like me to return to review breast compression/hand expression. Mom has my # to call when ready for consult.  Yellow colostrum stickers provided. Mom already has the booklet Providing breastmilk for your baby in the NICU.  Lurline Hare [facility] 04/08/2018, 9:38 AM

## 2018-04-09 MED ORDER — IBUPROFEN 600 MG PO TABS: 600.0000 mg | ORAL_TABLET | Freq: Four times a day (QID) | ORAL | 0 refills | Status: DC

## 2018-04-09 MED ORDER — NORETHINDRONE 0.35 MG PO TABS: 1.0000 | ORAL_TABLET | Freq: Every day | ORAL | 11 refills | Status: DC

## 2018-04-09 MED ORDER — ENALAPRIL MALEATE 5 MG PO TABS: 5.0000 mg | ORAL_TABLET | Freq: Every day | ORAL | 0 refills | Status: DC

## 2018-04-09 NOTE — Discharge Summary (Signed)
OB Discharge Summary  Patient Name: Michelle Taylor DOB: 02-02-1991 MRN: [id number]  Date of admission: 04/06/2018 Delivering MD: Guadalupe Dawn   Date of discharge: 04/09/2018  Admitting diagnosis: 59 WKS, PRE-E WORKUP Intrauterine pregnancy: [redacted]w[redacted]d    Secondary diagnosis:Principal Problem:   Preeclampsia, severe, third trimester Active Problems:   Prediabetes   Gestational diabetes  Additional problems: Same     Discharge diagnosis: Preterm Pregnancy Delivered, Preeclampsia (severe) and GDM A1                                                                     Post partum procedures:Magnesium Sulfate x 24 hours  Augmentation: Pitocin, Cytotec and Foley Balloon  Complications: None  Hospital course:  Induction of Labor With Vaginal Delivery   28y.o. yo G1P0101 at 3107w1das admitted to the hospital 04/06/2018 for induction of labor.  Indication for induction: Preeclampsia with severe features  Patient had an uncomplicated labor course as follows: cytotec followed by foley balloon placement. Got epidural and pitocin started. VAVD due to fetal bradycardia Membrane Rupture Time/Date: 12:36 PM ,04/07/2018   Intrapartum Procedures: Episiotomy: None [1]                                         Lacerations:  1st degree [2];Labial [10]  Patient had delivery of a Viable infant.  Information for the patient's newborn:  MaSkyy, Nilan0[[id number]]Delivery Method: Vaginal, Vacuum (Extractor)(Filed from Delivery Summary)   04/07/2018  Details of delivery can be found in separate delivery note.  Patient had postpartum course notable for Magnesium x 24 hours. BP control with Vasotec. Patient is discharged home 04/09/18.  Physical exam  Vitals:   04/08/18 1941 04/08/18 2330 04/09/18 0351 04/09/18 0920  BP: (!) 146/94 125/86 130/90 130/85  Pulse: 89 91 81 75  Resp: '18 18 18 18  ' Temp: 98.7 F (37.1 C) 98.5 F (36.9 C) 98.1 F (36.7 C) 98 F (36.7 C)  TempSrc: Oral Oral Oral  Oral  SpO2: 100% 96% 95% 100%  Weight:      Height:       General: alert, cooperative and no distress Lochia: appropriate Uterine Fundus: firm DVT Evaluation: No evidence of DVT seen on physical exam. Labs: Lab Results  Component Value Date   WBC 14.6 (H) 04/07/2018   HGB 11.8 (L) 04/07/2018   HCT 35.6 (L) 04/07/2018   MCV 91.8 04/07/2018   PLT 317 04/07/2018   CMP Latest Ref Rng & Units 04/06/2018  Glucose 70 - 99 mg/dL 98  BUN 6 - 20 mg/dL <5(L)  Creatinine 0.44 - 1.00 mg/dL 0.57  Sodium 135 - 145 mmol/L 134(L)  Potassium 3.5 - 5.1 mmol/L 3.3(L)  Chloride 98 - 111 mmol/L 105  CO2 22 - 32 mmol/L 23  Calcium 8.9 - 10.3 mg/dL 8.9  Total Protein 6.5 - 8.1 g/dL 6.7  Total Bilirubin 0.3 - 1.2 mg/dL 0.5  Alkaline Phos 38 - 126 U/L 100  AST 15 - 41 U/L 16  ALT 0 - 44 U/L 14    Discharge instruction: per After Visit Summary and "Baby and Me  Booklet".  After Visit Meds:  Allergies as of 04/09/2018   No Known Allergies     Medication List    STOP taking these medications   glucose blood test strip   glucose monitoring kit monitoring kit   Lancets 28G Misc     TAKE these medications   acetaminophen 500 MG tablet Commonly known as:  TYLENOL Take 1,000 mg by mouth every 6 (six) hours as needed for mild pain or headache.   enalapril 5 MG tablet Commonly known as:  VASOTEC Take 1 tablet (5 mg total) by mouth daily. Start taking on:  April 10, 2018   ibuprofen 600 MG tablet Commonly known as:  ADVIL,MOTRIN Take 1 tablet (600 mg total) by mouth every 6 (six) hours.   norethindrone 0.35 MG tablet Commonly known as:  MICRONOR,CAMILA,ERRIN Take 1 tablet (0.35 mg total) by mouth daily. Begin 4 wks post delivery   VITAFOL ULTRA 29-0.6-0.4-200 MG Caps Take 1 capsule by mouth daily.       Diet: routine diet  Activity: Advance as tolerated. Pelvic rest for 6 weeks.   Outpatient follow up:1 and 6 wks Follow up Appt:No future appointments. Follow up visit: No  follow-ups on file.  Please schedule this patient for Postpartum visit in: 6 weeks with the following provider: MD For C/S patients schedule nurse incision check in weeks 2 weeks: no High risk pregnancy complicated by: GDM and HTN Delivery mode:  Vacuum Anticipated Birth Control:  POPs PP Procedures needed: BP check in 1 week and 2 hour GTT with 6 wk check up Schedule Integrated [place] visit: no  Postpartum contraception: Progesterone only pills  Newborn Data: Live born female  Birth Weight: 3 lb 7.7 oz (1580 g) APGAR: 8, 9  Newborn Delivery   Birth date/time:  04/07/2018 20:37:00 Delivery type:  Vaginal, Vacuum (Extractor)     Baby Feeding: Breast Disposition:NICU   04/09/2018 Donnamae Jude, MD

## 2018-04-09 NOTE — Discharge Instructions (Signed)
Vaginal Delivery, Care After °Refer to this sheet in the next few weeks. These instructions provide you with information about caring for yourself after vaginal delivery. Your health care provider may also give you more specific instructions. Your treatment has been planned according to current medical practices, but problems sometimes occur. Call your health care provider if you have any problems or questions. °What can I expect after the procedure? °After vaginal delivery, it is common to have: °· Some bleeding from your vagina. °· Soreness in your abdomen, your vagina, and the area of skin between your vaginal opening and your anus (perineum). °· Pelvic cramps. °· Fatigue. °Follow these instructions at home: °Medicines °· Take over-the-counter and prescription medicines only as told by your health care provider. °· If you were prescribed an antibiotic medicine, take it as told by your health care provider. Do not stop taking the antibiotic until it is finished. °Driving ° °· Do not drive or operate heavy machinery while taking prescription pain medicine. °· Do not drive for 24 hours if you received a sedative. °Lifestyle °· Do not drink alcohol. This is especially important if you are breastfeeding or taking medicine to relieve pain. °· Do not use tobacco products, including cigarettes, chewing tobacco, or e-cigarettes. If you need help quitting, ask your health care provider. °Eating and drinking °· Drink at least 8 eight-ounce glasses of water every day unless you are told not to by your health care provider. If you choose to breastfeed your baby, you may need to drink more water than this. °· Eat high-fiber foods every day. These foods may help prevent or relieve constipation. High-fiber foods include: °? Whole grain cereals and breads. °? Brown rice. °? Beans. °? Fresh fruits and vegetables. °Activity °· Return to your normal activities as told by your health care provider. Ask your health care provider what  activities are safe for you. °· Rest as much as possible. Try to rest or take a nap when your baby is sleeping. °· Do not lift anything that is heavier than your baby or 10 lb (4.5 kg) until your health care provider says that it is safe. °· Talk with your health care provider about when you can engage in sexual activity. This may depend on your: °? Risk of infection. °? Rate of healing. °? Comfort and desire to engage in sexual activity. °Vaginal Care °· If you have an episiotomy or a vaginal tear, check the area every day for signs of infection. Check for: °? More redness, swelling, or pain. °? More fluid or blood. °? Warmth. °? Pus or a bad smell. °· Do not use tampons or douches until your health care provider says this is safe. °· Watch for any blood clots that may pass from your vagina. These may look like clumps of dark red, brown, or black discharge. °General instructions °· Keep your perineum clean and dry as told by your health care provider. °· Wear loose, comfortable clothing. °· Wipe from front to back when you use the toilet. °· Ask your health care provider if you can shower or take a bath. If you had an episiotomy or a perineal tear during labor and delivery, your health care provider may tell you not to take baths for a certain length of time. °· Wear a bra that supports your breasts and fits you well. °· If possible, have someone help you with household activities and help care for your baby for at least a few days after you   leave the hospital.  Keep all follow-up visits for you and your baby as told by your health care provider. This is important. Contact a health care provider if:  You have: ? Vaginal discharge that has a bad smell. ? Difficulty urinating. ? Pain when urinating. ? A sudden increase or decrease in the frequency of your bowel movements. ? More redness, swelling, or pain around your episiotomy or vaginal tear. ? More fluid or blood coming from your episiotomy or vaginal  tear. ? Pus or a bad smell coming from your episiotomy or vaginal tear. ? A fever. ? A rash. ? Little or no interest in activities you used to enjoy. ? Questions about caring for yourself or your baby.  Your episiotomy or vaginal tear feels warm to the touch.  Your episiotomy or vaginal tear is separating or does not appear to be healing.  Your breasts are painful, hard, or turn red.  You feel unusually sad or worried.  You feel nauseous or you vomit.  You pass large blood clots from your vagina. If you pass a blood clot from your vagina, save it to show to your health care provider. Do not flush blood clots down the toilet without having your health care provider look at them.  You urinate more than usual.  You are dizzy or light-headed.  You have not breastfed at all and you have not had a menstrual period for 12 weeks after delivery.  You have stopped breastfeeding and you have not had a menstrual period for 12 weeks after you stopped breastfeeding. Get help right away if:  You have: ? Pain that does not go away or does not get better with medicine. ? Chest pain. ? Difficulty breathing. ? Blurred vision or spots in your vision. ? Thoughts about hurting yourself or your baby.  You develop pain in your abdomen or in one of your legs.  You develop a severe headache.  You faint.  You bleed from your vagina so much that you fill two sanitary pads in one hour. This information is not intended to replace advice given to you by your health care provider. Make sure you discuss any questions you have with your health care provider. Document Released: 03/15/2000 Document Revised: 08/30/2015 Document Reviewed: 04/02/2015 Elsevier Interactive Patient Education  2019 [employer] Postpartum Hypertension Postpartum hypertension is high blood pressure that remains higher than normal after childbirth. You may not realize that you have postpartum hypertension if your blood pressure  is not being checked regularly. In most cases, postpartum hypertension will go away on its own, usually within a week of delivery. However, for some women, medical treatment is required to prevent serious complications, such as seizures or stroke. What are the causes? This condition may be caused by one or more of the following:  Hypertension that existed before pregnancy (chronic hypertension).  Hypertension that comes on as a result of pregnancy (gestational hypertension).  Hypertensive disorders during pregnancy (preeclampsia) or seizures in women who have high blood pressure during pregnancy (eclampsia).  A condition in which the liver, platelets, and red blood cells are damaged during pregnancy (HELLP syndrome).  A condition in which the thyroid produces too much hormones (hyperthyroidism).  Other rare problems of the nerves (neurological disorders) or blood disorders. In some cases, the cause may not be known. What increases the risk? The following factors may make you more likely to develop this condition:  Chronic hypertension. In some cases, this may not have been diagnosed before  pregnancy.  Obesity.  Type 2 diabetes.  Kidney disease.  History of preeclampsia or eclampsia.  Other medical conditions that change the level of hormones in the body (hormonal imbalance). What are the signs or symptoms? As with all types of hypertension, postpartum hypertension may not have any symptoms. Depending on how high your blood pressure is, you may experience:  Headaches. These may be mild, moderate, or severe. They may also be steady, constant, or sudden in onset (thunderclap headache).  Changes in your ability to see (visual changes).  Dizziness.  Shortness of breath.  Swelling of your hands, feet, lower legs, or face. In some cases, you may have swelling in more than one of these locations.  Heart palpitations or a racing heartbeat.  Difficulty breathing while lying  down.  Decrease in the amount of urine that you pass. Other rare signs and symptoms may include:  Sweating more than usual. This lasts longer than a few days after delivery.  Chest pain.  Sudden dizziness when you get up from sitting or lying down.  Seizures.  Nausea or vomiting.  Abdominal pain. How is this diagnosed? This condition may be diagnosed based on the results of a physical exam, blood pressure measurements, and blood and urine tests. You may also have other tests, such as a CT scan or an MRI, to check for other problems of postpartum hypertension. How is this treated? If blood pressure is high enough to require treatment, your options may include:  Medicines to reduce blood pressure (antihypertensives). Tell your health care provider if you are breastfeeding or if you plan to breastfeed. There are many antihypertensive medicines that are safe to take while breastfeeding.  Stopping medicines that may be causing hypertension.  Treating medical conditions that are causing hypertension.  Treating the complications of hypertension, such as seizures, stroke, or kidney problems. Your health care provider will also continue to monitor your blood pressure closely until it is within a safe range for you. Follow these instructions at home:  Take over-the-counter and prescription medicines only as told by your health care provider.  Return to your normal activities as told by your health care provider. Ask your health care provider what activities are safe for you.  Do not use any products that contain nicotine or tobacco, such as cigarettes and e-cigarettes. If you need help quitting, ask your health care provider.  Keep all follow-up visits as told by your health care provider. This is important. Contact a health care provider if:  Your symptoms get worse.  You have new symptoms, such as: ? A headache that does not get better. ? Dizziness. ? Visual changes. Get help  right away if:  You suddenly develop swelling in your hands, ankles, or face.  You have sudden, rapid weight gain.  You develop difficulty breathing, chest pain, racing heartbeat, or heart palpitations.  You develop severe pain in your abdomen.  You have any symptoms of a stroke. "BE FAST" is an easy way to remember the main warning signs of a stroke: ? B - Balance. Signs are dizziness, sudden trouble walking, or loss of balance. ? E - Eyes. Signs are trouble seeing or a sudden change in vision. ? F - Face. Signs are sudden weakness or numbness of the face, or the face or eyelid drooping on one side. ? A - Arms. Signs are weakness or numbness in an arm. This happens suddenly and usually on one side of the body. ? S - Speech. Signs are  Speech. Signs are sudden trouble speaking, slurred speech, or trouble understanding what people say. °? T - Time. Time to call emergency services. Write down what time symptoms started. °· You have other signs of a stroke, such as: °? A sudden, severe headache with no known cause. °? Nausea or vomiting. °? Seizure. °These symptoms may represent a serious problem that is an emergency. Do not wait to see if the symptoms will go away. Get medical help right away. Call your local emergency services (911 in the [place] Do not drive yourself to the hospital. °Summary °· Postpartum hypertension is high blood pressure that remains higher than normal after childbirth. °· In most cases, postpartum hypertension will go away on its own, usually within a week of delivery. °· For some women, medical treatment is required to prevent serious complications, such as seizures or stroke. °This information is not intended to replace advice given to you by your health care provider. Make sure you discuss any questions you have with your health care provider. °Document Released: 11/19/2013 Document Revised: 01/06/2017 Document Reviewed: 01/06/2017 °Elsevier Interactive Patient Education © 2019 Elsevier  Inc[employer] ° °

## 2018-04-09 NOTE — Progress Notes (Signed)
Discharge instructions given to patient. Discussed s/s of hypertension, postpartum care, medication changes and follow up appointments. IV removed.

## 2018-04-09 NOTE — Lactation Note (Signed)
This note was copied from a baby's chart. Lactation Consultation Note  Patient Name: Michelle Taylor Today's Date: 04/09/2018 Reason for consult: Follow-up assessment;Primapara;Late-preterm 34-36.6wks;1st time breastfeeding;NICU baby  Visited with P1 Mom of baby in the NICU.  Baby 37 hrs old, and LPTI.  Mom has been double pumping and feels comfortable to washing of pump parts.  Mom aware of importance of breast massage and hand expression.   Mom has [facility], referral sent to [facility] office. Mom aware of OP lactation support available to her.   Engorgement prevention and treatment reviewed. Mom to call prn for concerns.      Interventions Interventions: Breast feeding basics reviewed;Skin to skin;Breast massage;Hand express;DEBP  Lactation Tools Discussed/Used WIC Program: Yes Pump Review: Setup, frequency, and cleaning;Milk Storage Initiated by:: RN Date initiated:: 04/07/18   Consult Status Consult Status: Follow-up Date: 04/10/18 Follow-up type: In-patient    Judee Clara 04/09/2018, 10:19 AM

## 2018-04-10 ENCOUNTER — Encounter: Payer: Self-pay | Admitting: Advanced Practice Midwife

## 2018-04-10 DIAGNOSIS — O43819 Placental infarction, unspecified trimester: Secondary | ICD-10-CM | POA: Insufficient documentation

## 2018-04-14 ENCOUNTER — Ambulatory Visit: Payer: Self-pay

## 2018-04-14 LAB — CULTURE, BETA STREP (GROUP B ONLY)

## 2018-04-14 NOTE — Lactation Note (Signed)
This note was copied from a baby's chart. Lactation Consultation Note  Patient Name: Michelle Taylor Today's Date: 04/14/2018    Baby 56 days old and in NICU.  Mother pumping w/ symphony pump complaining of nipple soreness.  Mother aware of hands on pumping.  Observed mother pumping with #24 flanges.  By turning suction down mother had improved comfort.  Suggest mother use ebm or a small amount of coconut oil in flanges to help and try using #27 flanges when she gets home.  #24 flange appears to fit well but mother states her nipples are becoming larger and a bigger size may help.  Reviewed S&S of yeast infection and to call if she needs further assistance. Mother is pumping approx 70 ml per pumping session.  Praised her for her efforts.   Maternal Data    Feeding    LATCH Score                   Interventions    Lactation Tools Discussed/Used     Consult Status      Dahlia Byes [facility] 04/14/2018, 5:09 PM

## 2018-04-15 ENCOUNTER — Ambulatory Visit (INDEPENDENT_AMBULATORY_CARE_PROVIDER_SITE_OTHER): Payer: Medicaid Other

## 2018-04-15 ENCOUNTER — Encounter: Payer: Medicaid Other | Admitting: Obstetrics and Gynecology

## 2018-04-15 VITALS — BP 137/92 | HR 93 | Ht 60.0 in | Wt 170.0 lb

## 2018-04-15 DIAGNOSIS — Z0130 Encounter for examination of blood pressure without abnormal findings: Secondary | ICD-10-CM

## 2018-04-15 DIAGNOSIS — O1413 Severe pre-eclampsia, third trimester: Secondary | ICD-10-CM

## 2018-04-15 NOTE — Progress Notes (Signed)
Subjective:  Michelle Taylor is a 28 y.o. female here for BP check.   Hypertension ROS: taking medications as instructed, no medication side effects noted, no TIA's, no chest pain on exertion, no dyspnea on exertion and no swelling of ankles.    Objective:  BP (!) 137/92 (BP Location: Left Arm, Cuff Size: Normal)   Pulse 93   Ht 5' (1.524 m)   Wt 170 lb (77.1 kg)   LMP 08/04/2017   Breastfeeding Yes   BMI 33.20 kg/m   Appearance alert, well appearing, and in no distress. General exam BP noted to be well controlled today in office.    Assessment:   Blood Pressure asymptomatic.   Plan:  Current treatment plan is effective, no change in therapy. Monitor symptoms, take Medications as directed and return for Postpartum visit.

## 2018-04-15 NOTE — Progress Notes (Signed)
I have reviewed the chart and agree with nursing staff's documentation of this patient's encounter.  Catalina AntiguaPeggy Masayo Fera, MD 04/15/2018 10:40 AM

## 2018-04-20 ENCOUNTER — Ambulatory Visit (HOSPITAL_COMMUNITY): Payer: Medicaid Other

## 2018-05-05 ENCOUNTER — Ambulatory Visit (INDEPENDENT_AMBULATORY_CARE_PROVIDER_SITE_OTHER): Payer: BLUE CROSS/BLUE SHIELD | Admitting: Obstetrics & Gynecology

## 2018-05-05 ENCOUNTER — Encounter: Payer: Self-pay | Admitting: Obstetrics & Gynecology

## 2018-05-05 DIAGNOSIS — Z1389 Encounter for screening for other disorder: Secondary | ICD-10-CM | POA: Diagnosis not present

## 2018-05-05 NOTE — Patient Instructions (Signed)

## 2018-05-05 NOTE — Progress Notes (Signed)
Post Partum Exam  Michelle Taylor is a 28 y.o. [id number] female who presents for a postpartum visit. She is 4 weeks postpartum following a spontaneous vaginal delivery. I have fully reviewed the prenatal and intrapartum course. The delivery was at 35 gestational weeks.  Anesthesia: epidural. Postpartum course has been unremarkable. Baby's course has been unremarkable. Baby is feeding by breast. Bleeding no bleeding. Bowel function is normal. Bladder function is normal. Patient is not sexually active. Contraception method is oral progesterone-only contraceptive. Postpartum depression screening:neg (score 1)  The following portions of the patient's history were reviewed and updated as appropriate: allergies, current medications, past family history, past medical history, past social history, past surgical history and problem list. Last pap smear done 08/2017 and was Normal  Review of Systems Pertinent items are noted in HPI.    Objective:  Last menstrual period 08/04/2017, currently breastfeeding.  General:  alert, cooperative and no distress           Abdomen: soft, non-tender; bowel sounds normal; no masses,  no organomegaly   Vulva:  not evaluated  Vagina: not evaluated   Assessment:    normal postpartum exam. Pap smear not done at today's visit.   Plan:   1. Contraception: oral progesterone-only contraceptive 2. Start OCP, avoid intercourse at least one week 3. Follow up in: 2 weeks or as needed to check BP after she stops her BP medication  Adam Phenix, MD 05/05/2018

## 2018-05-19 ENCOUNTER — Ambulatory Visit (INDEPENDENT_AMBULATORY_CARE_PROVIDER_SITE_OTHER): Payer: BLUE CROSS/BLUE SHIELD

## 2018-05-19 VITALS — BP 123/79 | HR 73 | Wt 165.7 lb

## 2018-05-19 DIAGNOSIS — Z0130 Encounter for examination of blood pressure without abnormal findings: Secondary | ICD-10-CM

## 2018-05-19 NOTE — Progress Notes (Signed)
Pt is here for BP check. Pt delivered 04/07/18. At [facility] visit on 05/05/18 Dr. Debroah Loop instructed pt to discontinue BP med (Vasotec 5 mg) and return in 2 weeks for BP check. Pts BP is within normal limits today and she is asymptomatic. Advised pt to become established with a PCP in case her BP ever becomes elevated again, pt verbalized understanding.

## 2018-10-29 ENCOUNTER — Other Ambulatory Visit: Payer: Self-pay

## 2018-10-29 DIAGNOSIS — R6889 Other general symptoms and signs: Secondary | ICD-10-CM | POA: Diagnosis not present

## 2018-10-29 DIAGNOSIS — Z20822 Contact with and (suspected) exposure to covid-19: Secondary | ICD-10-CM

## 2018-10-31 LAB — NOVEL CORONAVIRUS, NAA: SARS-CoV-2, NAA: NOT DETECTED

## 2018-11-13 ENCOUNTER — Other Ambulatory Visit: Payer: Self-pay

## 2018-11-13 DIAGNOSIS — Z20822 Contact with and (suspected) exposure to covid-19: Secondary | ICD-10-CM

## 2018-11-15 LAB — NOVEL CORONAVIRUS, NAA: SARS-CoV-2, NAA: NOT DETECTED

## 2018-12-25 ENCOUNTER — Encounter (HOSPITAL_COMMUNITY): Payer: Self-pay | Admitting: Emergency Medicine

## 2018-12-25 ENCOUNTER — Emergency Department (HOSPITAL_COMMUNITY)
Admission: EM | Admit: 2018-12-25 | Discharge: 2018-12-25 | Disposition: A | Discharge status: Home / Self Care | Payer: BC Managed Care – PPO | Attending: Emergency Medicine | Admitting: Emergency Medicine

## 2018-12-25 ENCOUNTER — Other Ambulatory Visit: Payer: Self-pay

## 2018-12-25 DIAGNOSIS — N939 Abnormal uterine and vaginal bleeding, unspecified: Secondary | ICD-10-CM | POA: Insufficient documentation

## 2018-12-25 DIAGNOSIS — Z87891 Personal history of nicotine dependence: Secondary | ICD-10-CM | POA: Insufficient documentation

## 2018-12-25 DIAGNOSIS — Z79899 Other long term (current) drug therapy: Secondary | ICD-10-CM | POA: Insufficient documentation

## 2018-12-25 LAB — CBC WITH DIFFERENTIAL/PLATELET
Abs Immature Granulocytes: 0.01 10*3/uL (ref 0.00–0.07)
Basophils Absolute: 0 10*3/uL (ref 0.0–0.1)
Basophils Relative: 1 %
Eosinophils Absolute: 0 10*3/uL (ref 0.0–0.5)
Eosinophils Relative: 1 %
HCT: 41.8 % (ref 36.0–46.0)
Hemoglobin: 14.4 g/dL (ref 12.0–15.0)
Immature Granulocytes: 0 %
Lymphocytes Relative: 43 %
Lymphs Abs: 2.3 10*3/uL (ref 0.7–4.0)
MCH: 31.3 pg (ref 26.0–34.0)
MCHC: 34.4 g/dL (ref 30.0–36.0)
MCV: 90.9 fL (ref 80.0–100.0)
Monocytes Absolute: 0.6 10*3/uL (ref 0.1–1.0)
Monocytes Relative: 10 %
Neutro Abs: 2.4 10*3/uL (ref 1.7–7.7)
Neutrophils Relative %: 45 %
Platelets: 306 10*3/uL (ref 150–400)
RBC: 4.6 MIL/uL (ref 3.87–5.11)
RDW: 13.3 % (ref 11.5–15.5)
WBC: 5.3 10*3/uL (ref 4.0–10.5)
nRBC: 0 % (ref 0.0–0.2)

## 2018-12-25 LAB — URINALYSIS, ROUTINE W REFLEX MICROSCOPIC
Bilirubin Urine: NEGATIVE
Glucose, UA: NEGATIVE mg/dL
Ketones, ur: NEGATIVE mg/dL
Leukocytes,Ua: NEGATIVE
Nitrite: NEGATIVE
Protein, ur: NEGATIVE mg/dL
Specific Gravity, Urine: 1.004 — ABNORMAL LOW (ref 1.005–1.030)
pH: 7 (ref 5.0–8.0)

## 2018-12-25 LAB — WET PREP, GENITAL
Clue Cells Wet Prep HPF POC: NONE SEEN
Sperm: NONE SEEN
Trich, Wet Prep: NONE SEEN
Yeast Wet Prep HPF POC: NONE SEEN

## 2018-12-25 LAB — I-STAT BETA HCG BLOOD, ED (MC, WL, AP ONLY): I-stat hCG, quantitative: <5 m[IU]/mL (ref <5)

## 2018-12-25 NOTE — Discharge Instructions (Signed)
You were seen in the ED today for vaginal bleeding Your labwork was all very reassuring today Please follow up with [facility] regarding this. They have walk in hours for new patients - you can call them to find out when exactly these are We have tested you for STDs today - you will receive a call in 2-3 days if any of them come back positive. You will not receive a call if they are negative.

## 2018-12-25 NOTE — ED Triage Notes (Signed)
Pt endorses light spotting since August 13th until recently, now experiencing heavier bleeding and passing large blood clots. Feb gave birth, not taking any oral contraceptives. Home preg neg. Denies cramping, chest pain or sob. Reports fatigue.

## 2018-12-25 NOTE — ED Provider Notes (Signed)
[facility]GENCY [facility] Provider Note   CSN: [id number] Arrival date & time: 12/25/18  [id number]     History   Chief Complaint Chief Complaint  Patient presents with  . Vaginal Bleeding    HPI Michelle Taylor is a 28 y.o. female who presents to the ED waning of vaginal bleeding since November 12, 2018.  Patient reports that she had her normal menstrual period on June 15.  She did not have a period in July and then August 13 began having light spotting.  She reports that for the past 5 or 6 days she has had heavier bleeding and is now passing large blood clots.  Patient did recently give birth to her daughter in February.  She is not currently on any oral contraceptives.  Reports that she was taking some after her birth but it broke her skin out so she stopped taking it.  She does not currently have an OB/GYN in the area.  She is sexually active with one female partner for the past 3 years.  States she did take a home pregnancy test sometime in July when she missed her period and reports that it was negative.  Denies fever, chills, shortness of breath, chest pain, nausea, vomiting, abdominal pain, pelvic pain, vaginal discharge, any other associated symptoms.        Past Medical History:  Diagnosis Date  . Chlamydia 2008  . Gestational diabetes   . Yeast infection     Patient Active Problem List   Diagnosis Date Noted  . Prediabetes 11/17/2017    Past Surgical History:  Procedure Laterality Date  . PILONIDAL CYST EXCISION    . WISDOM TOOTH EXTRACTION       OB History    Gravida  1   Para  1   Term      Preterm  1   AB      Living  1     SAB      TAB      Ectopic      Multiple  0   Live Births  1            Home Medications    Prior to Admission medications   Medication Sig Start Date End Date Taking? Authorizing Provider  acetaminophen (TYLENOL) 500 MG tablet Take 1,000 mg by mouth every 6 (six) hours as needed for mild pain  or headache.    [provider]  enalapril (VASOTEC) 5 MG tablet Take 1 tablet (5 mg total) by mouth daily. Patient not taking: Reported on 05/19/2018 04/10/18   Reva BoresPratt, Tanya S, MD  ibuprofen (ADVIL,MOTRIN) 600 MG tablet Take 1 tablet (600 mg total) by mouth every 6 (six) hours. Patient not taking: Reported on 05/05/2018 04/09/18   Reva BoresPratt, Tanya S, MD  norethindrone (MICRONOR,CAMILA,ERRIN) 0.35 MG tablet Take 1 tablet (0.35 mg total) by mouth daily. Begin 4 wks post delivery 04/09/18   Reva BoresPratt, Tanya S, MD  Prenat-Fe Poly-Methfol-FA-DHA (VITAFOL ULTRA) 29-0.6-0.4-200 MG CAPS Take 1 capsule by mouth daily. 10/20/17   Leftwich-Kirby, Wilmer FloorLisa A, CNM    Family History Family History  Problem Relation Age of Onset  . Hypertension Paternal Grandmother   . Hypertension Paternal Grandfather     Social History Social History   Tobacco Use  . Smoking status: Former Smoker    Packs/day: 0.25    Types: Cigarettes  . Smokeless tobacco: Never Used  Substance Use Topics  . Alcohol use: Not Currently  . Drug  use: No     Allergies   Patient has no known allergies.   Review of Systems Review of Systems  Constitutional: Negative for chills and fever.  HENT: Negative for congestion.   Eyes: Negative for visual disturbance.  Respiratory: Negative for cough and shortness of breath.   Cardiovascular: Negative for chest pain.  Gastrointestinal: Negative for abdominal pain, constipation, diarrhea, nausea and vomiting.  Genitourinary: Positive for menstrual problem and vaginal bleeding. Negative for difficulty urinating, dysuria and frequency.  Musculoskeletal: Negative for neck pain.  Skin: Negative for rash.  Neurological: Negative for syncope and headaches.     Physical Exam Updated Vital Signs BP 109/73 (BP Location: Right Arm)   Pulse 88   Temp 98.3 F (36.8 C) (Oral)   Resp 16   LMP 11/12/2018   SpO2 100%   Physical Exam Vitals signs and nursing note reviewed.  Constitutional:       Appearance: She is not ill-appearing.  HENT:     Head: Normocephalic and atraumatic.  Eyes:     Conjunctiva/sclera: Conjunctivae normal.  Neck:     Musculoskeletal: Neck supple.  Cardiovascular:     Rate and Rhythm: Normal rate and regular rhythm.     Pulses: Normal pulses.  Pulmonary:     Effort: Pulmonary effort is normal.     Breath sounds: Normal breath sounds. No wheezing, rhonchi or rales.  Abdominal:     Palpations: Abdomen is soft.     Tenderness: There is no abdominal tenderness. There is no guarding or rebound.  Genitourinary:    Comments: Chaperone present for exam Luisa Hart, RN No rashes, lesions, or tenderness to external genitalia. No erythema, injury, or tenderness to vaginal mucosa. Small amount of bright red blood in vaginal vault. No discharge appreciated. No adnexal masses, tenderness, or fullness. No CMT, cervical friability, or discharge from cervical os. Cervical os is closed. Uterus non-deviated, mobile, nonTTP, and without enlargement.  Skin:    General: Skin is warm and dry.  Neurological:     Mental Status: She is alert.      ED Treatments / Results  Labs (all labs ordered are listed, but only abnormal results are displayed) Labs Reviewed  WET PREP, GENITAL - Abnormal; Notable for the following components:      Result Value   WBC, Wet Prep HPF POC FEW (*)    All other components within normal limits  URINALYSIS, ROUTINE W REFLEX MICROSCOPIC - Abnormal; Notable for the following components:   Specific Gravity, Urine 1.004 (*)    Hgb urine dipstick LARGE (*)    Bacteria, UA RARE (*)    All other components within normal limits  CBC WITH DIFFERENTIAL/PLATELET  RPR  HIV ANTIBODY (ROUTINE TESTING W REFLEX)  I-STAT BETA HCG BLOOD, ED ([facility], [facility], [facility] ONLY)  GC/CHLAMYDIA PROBE AMP ([place]) NOT AT [facility]    EKG None  Radiology No results found.  Procedures Procedures (including critical care time)  Medications Ordered in ED Medications - No  data to display   Initial Impression / Assessment and Plan / ED Course  I have reviewed the triage vital signs and the nursing notes.  Pertinent labs & imaging results that were available during my care of the patient were reviewed by me and considered in my medical decision making (see chart for details).    28 year old female who presents to the ED today complaining of vaginal bleeding since August 13.  She states that she missed her period in July and  took a home pregnancy test which was negative.  States that she has had light spotting since but for the past 5 days has had heavy blood clots.  States she is gone through 8 large pads per day.  Is not currently on birth control.  She is sexually active with one female partner for the past 3 years.  Did recently give birth to her daughter in February.  She denies any complaints including vision changes, shortness of breath, chest pain, dizziness, lightheadedness, syncope.  Obtain CBC, pregnancy test, urinalysis at this time.  Will perform pelvic exam as well.  If patient pregnant she will need to be transferred to MAU with concern for pregnancy.  Patient is denying any pelvic pain at this time.   Hemoglobin stable at this time despite patient stating that she has gone through a large pads for the past 5 days.  Urinalysis does pick up hemoglobin but there is no bacteria appreciated.  Beta-hCG negative.  Pelvic Exam completed.  There is some blood in the vaginal vault but patient does not appear to be hemorrhaging from the office.  Os is closed.  Will await wet prep results discharge accordingly.  Patient will need to follow-up outpatient with women's clinic.   Wet Prep negative at this time.  Will discharge patient home with women's clinic follow-up.  Return precautions have been discussed with patient.  She is notified that she will receive a call in 2 to 3 days if she test positive for any of the STDs.  She is in agreement with plan at this time and  stable for discharge home.  This note was prepared using Dragon voice recognition software and may include unintentional dictation errors due to the inherent limitations of voice recognition software.       Final Clinical Impressions(s) / ED Diagnoses   Final diagnoses:  Vaginal bleeding  Abnormal uterine bleeding    ED Discharge Orders    None       Eustaquio Maize, PA-C 12/25/18 1154    Lacretia Leigh, MD 12/28/18 1233

## 2018-12-25 NOTE — ED Notes (Signed)
Patient verbalizes understanding of discharge instructions. Opportunity for questioning and answers were provided. Armband removed by staff, pt discharged from ED.  

## 2018-12-26 LAB — HIV ANTIBODY (ROUTINE TESTING W REFLEX): HIV Screen 4th Generation wRfx: NONREACTIVE

## 2018-12-26 LAB — CERVICOVAGINAL ANCILLARY ONLY
Chlamydia: NEGATIVE
Neisseria Gonorrhea: NEGATIVE

## 2018-12-26 LAB — RPR: RPR Ser Ql: NONREACTIVE

## 2019-01-01 DIAGNOSIS — R87613 High grade squamous intraepithelial lesion on cytologic smear of cervix (HGSIL): Secondary | ICD-10-CM | POA: Diagnosis not present

## 2019-01-01 DIAGNOSIS — Z01419 Encounter for gynecological examination (general) (routine) without abnormal findings: Secondary | ICD-10-CM | POA: Diagnosis not present

## 2019-01-11 ENCOUNTER — Ambulatory Visit (INDEPENDENT_AMBULATORY_CARE_PROVIDER_SITE_OTHER): Payer: BC Managed Care – PPO | Admitting: Obstetrics and Gynecology

## 2019-01-11 ENCOUNTER — Other Ambulatory Visit: Payer: Self-pay

## 2019-01-11 ENCOUNTER — Encounter: Payer: Self-pay | Admitting: Obstetrics and Gynecology

## 2019-01-11 VITALS — BP 137/85 | HR 112 | Wt 175.6 lb

## 2019-01-11 DIAGNOSIS — N939 Abnormal uterine and vaginal bleeding, unspecified: Secondary | ICD-10-CM

## 2019-01-11 MED ORDER — LO LOESTRIN FE 1 MG-10 MCG / 10 MCG PO TABS: 1.0000 | ORAL_TABLET | Freq: Every day | ORAL | 4 refills | Status: DC

## 2019-01-11 NOTE — Progress Notes (Signed)
Pt is here for prolonged bleeding. Pt reports she had been bleeding since August 13th of this year. Pt reports mid September she started having large clots along with the bleeding. Pt reports bleeding stopped around September 30th. Pt had pap smear at the [facility] department on 01/04/19, Pt reports that the pap was abnormal.

## 2019-01-11 NOTE — Progress Notes (Signed)
28 yo G1P0101 here for the evaluation of abnormal uterine bleeding. Patient reports a monthly period regularly until July when she skipped a period. She reports return of her period on August 13 which did not end until 9/30. Patient reports passage of clots and lower abdominal cramping. Patient is sexually active with her partner of 3 years. She was previously on Micronor but self discontinued it due to dermatologic issues  Past Medical History:  Diagnosis Date  . Chlamydia 2008  . Gestational diabetes   . Yeast infection    Past Surgical History:  Procedure Laterality Date  . PILONIDAL CYST EXCISION    . WISDOM TOOTH EXTRACTION     Family History  Problem Relation Age of Onset  . Hypertension Paternal Grandmother   . Hypertension Paternal Grandfather    Social History   Tobacco Use  . Smoking status: Former Smoker    Packs/day: 0.25    Types: Cigarettes  . Smokeless tobacco: Never Used  Substance Use Topics  . Alcohol use: Not Currently  . Drug use: No   ROS See pertinent in HPI  Blood pressure 137/85, pulse (!) 112, weight 175 lb 9.6 oz (79.7 kg), last menstrual period 12/30/2018, not currently breastfeeding. GENERAL: Well-developed, well-nourished female in no acute distress.  ABDOMEN: Soft, nontender, nondistended. No organomegaly. EXTREMITIES: No cyanosis, clubbing, or edema, 2+ distal pulses.  A/P 28 yo with AUB - pelvic ultrasound ordered - discussed medical management with contraception- Patient opted to restart COC - Patient reports abnormal pap smear with health department on 01/04/19- records requested - RTC prn

## 2019-01-19 ENCOUNTER — Ambulatory Visit (HOSPITAL_COMMUNITY)
Admission: RE | Admit: 2019-01-19 | Discharge: 2019-01-19 | Disposition: A | Discharge status: Home / Self Care | Payer: BC Managed Care – PPO | Source: Ambulatory Visit | Attending: Obstetrics and Gynecology | Admitting: Obstetrics and Gynecology

## 2019-01-19 ENCOUNTER — Other Ambulatory Visit: Payer: Self-pay

## 2019-01-19 DIAGNOSIS — N939 Abnormal uterine and vaginal bleeding, unspecified: Secondary | ICD-10-CM

## 2019-02-01 ENCOUNTER — Other Ambulatory Visit: Payer: Self-pay

## 2019-02-01 DIAGNOSIS — Z20822 Contact with and (suspected) exposure to covid-19: Secondary | ICD-10-CM

## 2019-02-02 LAB — NOVEL CORONAVIRUS, NAA: SARS-CoV-2, NAA: NOT DETECTED

## 2019-02-09 ENCOUNTER — Encounter: Payer: Self-pay | Admitting: Obstetrics and Gynecology

## 2019-02-09 ENCOUNTER — Other Ambulatory Visit: Payer: Self-pay

## 2019-02-09 ENCOUNTER — Other Ambulatory Visit (HOSPITAL_COMMUNITY)
Admission: RE | Admit: 2019-02-09 | Discharge: 2019-02-09 | Disposition: A | Discharge status: Home / Self Care | Payer: BC Managed Care – PPO | Source: Ambulatory Visit | Attending: Obstetrics and Gynecology | Admitting: Obstetrics and Gynecology

## 2019-02-09 ENCOUNTER — Ambulatory Visit (INDEPENDENT_AMBULATORY_CARE_PROVIDER_SITE_OTHER): Payer: BC Managed Care – PPO | Admitting: Obstetrics and Gynecology

## 2019-02-09 VITALS — BP 138/86 | HR 93 | Wt 177.1 lb

## 2019-02-09 DIAGNOSIS — R87613 High grade squamous intraepithelial lesion on cytologic smear of cervix (HGSIL): Secondary | ICD-10-CM | POA: Insufficient documentation

## 2019-02-09 DIAGNOSIS — D069 Carcinoma in situ of cervix, unspecified: Secondary | ICD-10-CM | POA: Diagnosis not present

## 2019-02-09 DIAGNOSIS — Z3202 Encounter for pregnancy test, result negative: Secondary | ICD-10-CM

## 2019-02-09 LAB — POCT URINE PREGNANCY: Preg Test, Ur: NEGATIVE

## 2019-02-09 NOTE — Progress Notes (Signed)
Pt is here for colposcopy. Abnormal pap on 01/01/2019 [facility]IL at [facility].

## 2019-02-09 NOTE — Progress Notes (Signed)
Colposcopy Procedure Note  Michelle Taylor is a 28 y.o. G1P0101 here for colposcopy.  Indications:  Pap 12/2018: HSIL, no HPV given   Procedure Details  LMP 12/2018; UPT negative.    The risks (including infection, bleeding, pain) and benefits of the procedure were explained to the patient and written informed consent was obtained.  The patient was placed in the dorsal lithotomy position. A Graves was speculum inserted in the vagina, and the cervix was visualized.  The cervix was stained with acetic acid and visualized using the colposcope under magnification as well as with a green filter. Findings as below. Cervical biopsies were taken at 12, 3, 6, 9 o'clock. Endocervical curettage then performed in all four quadrants. Small amount of bleeding noted that improved with pressure. Monsel's solution applied with good hemostasis noted. Patient tolerating procedure well.  Findings: vascularity at 12 o'clock, glandular tissue with acetowhite change 360 around SCJ, increased uptake 6-9 o'clock  Impression: low grade  Adequate: yes  Specimens: 1. 12, 3, 6, 9 o'clock 2. Endocervical curettage  Condition: Stable  Complications: None  Plan: The patient was advised to call for any fever or for prolonged or severe pain or bleeding. She was advised to use OTC analgesics as needed for mild to moderate pain. She was advised to avoid vaginal intercourse for 48 hours or until the bleeding has completely stopped.  Will base further management on results of biopsy.   Feliz Beam, M.D. Attending Center for [employer] manager)

## 2019-02-11 LAB — SURGICAL PATHOLOGY

## 2019-03-09 ENCOUNTER — Telehealth (INDEPENDENT_AMBULATORY_CARE_PROVIDER_SITE_OTHER): Payer: BC Managed Care – PPO | Admitting: Obstetrics and Gynecology

## 2019-03-09 DIAGNOSIS — D069 Carcinoma in situ of cervix, unspecified: Secondary | ICD-10-CM

## 2019-03-09 NOTE — Progress Notes (Signed)
I connected with  Michelle Taylor on 03/09/19 by a video enabled telemedicine application and verified that I am speaking with the correct person using two identifiers.   I discussed the limitations of evaluation and management by telemedicine. The patient expressed understanding and agreed to proceed.  Leep FU, c/o no periods since September

## 2019-03-09 NOTE — Progress Notes (Signed)
    TELEHEALTH GYNECOLOGY VIRTUAL VIDEO VISIT ENCOUNTER NOTE  Provider location: [facility] for [employer] at [place]   I connected with Michelle Taylor on 03/12/19 at 10:30 AM EST by MyChart Video Encounter at home and verified that I am speaking with the correct person using two identifiers.   I discussed the limitations, risks, security and privacy concerns of performing an evaluation and management service virtually and the availability of in person appointments. I also discussed with the patient that there may be a patient responsible charge related to this service. The patient expressed understanding and agreed to proceed.   History:  Michelle Taylor is a 28 y.o. G37P0101 female who presents for follow up for discussion of results from colposcopy. She denies any abnormal vaginal discharge, bleeding, pelvic pain or other concerns.    Pap 12/2018: HSIL, no HPV given Colpo 01/2019: CIN2-3 on biopsy, negative ECC     Past Medical History:  Diagnosis Date  . Chlamydia 2008  . Gestational diabetes   . Yeast infection    Past Surgical History:  Procedure Laterality Date  . PILONIDAL CYST EXCISION    . WISDOM TOOTH EXTRACTION     The following portions of the patient's history were reviewed and updated as appropriate: allergies, current medications, past family history, past medical history, past social history, past surgical history and problem list.   Review of Systems:  Pertinent items noted in HPI and remainder of comprehensive ROS otherwise negative.  Physical Exam:   General:  Alert, oriented and cooperative. Patient appears to be in no acute distress.  Mental Status: Normal mood and affect. Normal behavior. Normal judgment and thought content.   Respiratory: Normal respiratory effort, no problems with respiration noted  Rest of physical exam deferred due to type of encounter  Labs and Imaging FINAL MICROSCOPIC DIAGNOSIS:   A. 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 ENDOCERVIX, CURETTAGE:  - Benign cervical glandular mucosa  - Negative for dysplasia or malignancy   Assessment and Plan:   1. High grade squamous intraepithelial lesion (HGSIL), grade 3 CIN, on biopsy of cervix Reviewed results of colposcopy/ECC with patient. Reviewed risks of progression to carcinoma of cervix. Reviewed recommendation for LEEP. Reviewed LEEP in detail, reviewed risks/benefits. Reviewed risks of pre-term delivery in future pregnancy. Answered all questions. She is agreeable to proceed with LEEP. Will have scheduled at her convenience.    I discussed the assessment and treatment plan with the patient. The patient was provided an opportunity to ask questions and all were answered. The patient agreed with the plan and demonstrated an understanding of the instructions.   The patient was advised to call back or seek an in-person evaluation/go to the ED if the symptoms worsen or if the condition fails to improve as anticipated.  I provided 25 minutes of face-to-face time during this encounter.   Sloan Leiter, MD [facility] for [place], [place]

## 2019-03-12 ENCOUNTER — Encounter: Payer: Self-pay | Admitting: Obstetrics and Gynecology

## 2019-07-10 ENCOUNTER — Ambulatory Visit: Payer: BC Managed Care – PPO | Attending: Internal Medicine

## 2019-07-10 DIAGNOSIS — Z23 Encounter for immunization: Secondary | ICD-10-CM

## 2019-07-10 NOTE — Progress Notes (Signed)
   Covid-19 Vaccination Clinic  Name:  Michelle Taylor    MRN: [id number] DOB: 07/05/1990  07/10/2019  Michelle Taylor was observed post Covid-19 immunization for 15 minutes without incident. She was provided with Vaccine Information Sheet and instruction to access the V-Safe system.   Michelle Taylor was instructed to call 911 with any severe reactions post vaccine: Marland Kitchen Difficulty breathing  . Swelling of face and throat  . A fast heartbeat  . A bad rash all over body  . Dizziness and weakness   Immunizations Administered    Name Date Dose VIS Date Route   Pfizer COVID-19 Vaccine 07/10/2019 12:40 PM 0.3 mL 03/12/2019 Intramuscular   Manufacturer: [employer], [employer]   Lot: [id number]   NDC: [address]-7

## 2019-07-26 ENCOUNTER — Ambulatory Visit (HOSPITAL_COMMUNITY)
Admission: EM | Admit: 2019-07-26 | Discharge: 2019-07-26 | Discharge status: Against Medical Advice | Payer: BC Managed Care – PPO

## 2019-07-26 ENCOUNTER — Other Ambulatory Visit: Payer: Self-pay

## 2019-07-26 DIAGNOSIS — N611 Abscess of the breast and nipple: Secondary | ICD-10-CM | POA: Diagnosis not present

## 2019-08-02 ENCOUNTER — Ambulatory Visit: Payer: BC Managed Care – PPO | Attending: Internal Medicine

## 2019-08-02 DIAGNOSIS — Z23 Encounter for immunization: Secondary | ICD-10-CM

## 2019-08-02 NOTE — Progress Notes (Signed)
   Covid-19 Vaccination Clinic  Name:  Masiah Lewing    MRN: [id number] DOB: 1990/07/06  08/02/2019  Ms. Fojtik was observed post Covid-19 immunization for 15 minutes without incident. She was provided with Vaccine Information Sheet and instruction to access the V-Safe system.   Ms. Castagnola was instructed to call 911 with any severe reactions post vaccine: Marland Kitchen Difficulty breathing  . Swelling of face and throat  . A fast heartbeat  . A bad rash all over body  . Dizziness and weakness   Immunizations Administered    Name Date Dose VIS Date Route   Pfizer COVID-19 Vaccine 08/02/2019  3:44 PM 0.3 mL 05/26/2018 Intramuscular   Manufacturer: [employer], [employer]   Lot: [id number]   NDC: [address]-2

## 2019-08-16 ENCOUNTER — Ambulatory Visit: Payer: BC Managed Care – PPO | Attending: Internal Medicine

## 2019-08-16 DIAGNOSIS — Z20822 Contact with and (suspected) exposure to covid-19: Secondary | ICD-10-CM

## 2019-08-17 LAB — SARS-COV-2, NAA 2 DAY TAT

## 2019-08-17 LAB — NOVEL CORONAVIRUS, NAA: SARS-CoV-2, NAA: NOT DETECTED

## 2019-11-03 ENCOUNTER — Other Ambulatory Visit: Payer: Self-pay

## 2019-11-03 ENCOUNTER — Ambulatory Visit (INDEPENDENT_AMBULATORY_CARE_PROVIDER_SITE_OTHER): Payer: Medicaid Other

## 2019-11-03 DIAGNOSIS — O0990 Supervision of high risk pregnancy, unspecified, unspecified trimester: Secondary | ICD-10-CM | POA: Diagnosis not present

## 2019-11-03 LAB — POCT URINE PREGNANCY: Preg Test, Ur: POSITIVE — AB

## 2019-11-03 MED ORDER — BLOOD PRESSURE KIT DEVI: 1.0000 | 0 refills | Status: DC | PRN

## 2019-11-03 MED ORDER — VITAFOL ULTRA 29-0.6-0.4-200 MG PO CAPS: 1.0000 | ORAL_CAPSULE | Freq: Every day | ORAL | 11 refills | Status: DC

## 2019-11-03 MED ORDER — GOJJI WEIGHT SCALE MISC: 1.0000 | Freq: Every day | 0 refills | Status: AC | PRN

## 2019-11-03 NOTE — Progress Notes (Signed)
Michelle Taylor presents today for UPT. She has no unusual complaints. Pt requests Vitafol Ultra PNV's  LMP: 09/20/19     OBJECTIVE: Appears well, in no apparent distress.  OB History    Gravida  2   Para  1   Term      Preterm  1   AB      Living  1     SAB      TAB      Ectopic      Multiple  0   Live Births  1          Home UPT Result:positive  In-Office UPT result:positive  I have reviewed the patient's medical, obstetrical, social, and family histories, and medications.   ASSESSMENT: Positive pregnancy test  Assessment and Plan: High Risk - Hx of preterm labor and preeclampsia  P[facility]atal care at [facility]  Schedule NOB intake and u/s in 2 wks Labs to be completed at [facility] provider visit in 4 wks Download Babyscripts BP cuff and scale sent to [facility]  Pt reqs Vitafol PNV's - she's aware ins may not cover

## 2019-11-03 NOTE — Progress Notes (Signed)
Patient was assessed and managed by nursing staff during this encounter. I have reviewed the chart and agree with the documentation and plan. I have also made any necessary editorial changes.  Kennan Detter A Kynisha Memon, MD 11/03/2019 1:10 PM   

## 2019-11-03 NOTE — Patient Instructions (Signed)
First Trimester of Pregnancy  The first trimester of pregnancy is from week 1 until the end of week 13 (months 1 through 3). During this time, your baby will begin to develop inside you. At 6-8 weeks, the eyes and face are formed, and the heartbeat can be seen on ultrasound. At the end of 12 weeks, all the baby's organs are formed. Prenatal care is all the medical care you receive before the birth of your baby. Make sure you get good prenatal care and follow all of your doctor's instructions. Follow these instructions at home: Medicines  Take over-the-counter and prescription medicines only as told by your doctor. Some medicines are safe and some medicines are not safe during pregnancy.  Take a prenatal vitamin that contains at least 600 micrograms (mcg) of folic acid.  If you have trouble pooping (constipation), take medicine that will make your stool soft (stool softener) if your doctor approves. Eating and drinking   Eat regular, healthy meals.  Your doctor will tell you the amount of weight gain that is right for you.  Avoid raw meat and uncooked cheese.  If you feel sick to your stomach (nauseous) or throw up (vomit): ? Eat 4 or 5 small meals a day instead of 3 large meals. ? Try eating a few soda crackers. ? Drink liquids between meals instead of during meals.  To prevent constipation: ? Eat foods that are high in fiber, like fresh fruits and vegetables, whole grains, and beans. ? Drink enough fluids to keep your pee (urine) clear or pale yellow. Activity  Exercise only as told by your doctor. Stop exercising if you have cramps or pain in your lower belly (abdomen) or low back.  Do not exercise if it is too hot, too humid, or if you are in a place of great height (high altitude).  Try to avoid standing for long periods of time. Move your legs often if you must stand in one place for a long time.  Avoid heavy lifting.  Wear low-heeled shoes. Sit and stand up  straight.  You can have sex unless your doctor tells you not to. Relieving pain and discomfort  Wear a good support bra if your breasts are sore.  Take warm water baths (sitz baths) to soothe pain or discomfort caused by hemorrhoids. Use hemorrhoid cream if your doctor says it is okay.  Rest with your legs raised if you have leg cramps or low back pain.  If you have puffy, bulging veins (varicose veins) in your legs: ? Wear support hose or compression stockings as told by your doctor. ? Raise (elevate) your feet for 15 minutes, 3-4 times a day. ? Limit salt in your food. Prenatal care  Schedule your prenatal visits by the twelfth week of pregnancy.  Write down your questions. Take them to your prenatal visits.  Keep all your prenatal visits as told by your doctor. This is important. Safety  Wear your seat belt at all times when driving.  Make a list of emergency phone numbers. The list should include numbers for family, friends, the hospital, and police and fire departments. General instructions  Ask your doctor for a referral to a local prenatal class. Begin classes no later than at the start of month 6 of your pregnancy.  Ask for help if you need counseling or if you need help with nutrition. Your doctor can give you advice or tell you where to go for help.  Do not use hot tubs, steam   rooms, or saunas.  Do not douche or use tampons or scented sanitary pads.  Do not cross your legs for long periods of time.  Avoid all herbs and alcohol. Avoid drugs that are not approved by your doctor.  Do not use any tobacco products, including cigarettes, chewing tobacco, and electronic cigarettes. If you need help quitting, ask your doctor. You may get counseling or other support to help you quit.  Avoid cat litter boxes and soil used by cats. These carry germs that can cause birth defects in the baby and can cause a loss of your baby (miscarriage) or stillbirth.  Visit your dentist.  At home, brush your teeth with a soft toothbrush. Be gentle when you floss. Contact a doctor if:  You are dizzy.  You have mild cramps or pressure in your lower belly.  You have a nagging pain in your belly area.  You continue to feel sick to your stomach, you throw up, or you have watery poop (diarrhea).  You have a bad smelling fluid coming from your vagina.  You have pain when you pee (urinate).  You have increased puffiness (swelling) in your face, hands, legs, or ankles. Get help right away if:  You have a fever.  You are leaking fluid from your vagina.  You have spotting or bleeding from your vagina.  You have very bad belly cramping or pain.  You gain or lose weight rapidly.  You throw up blood. It may look like coffee grounds.  You are around people who have German measles, fifth disease, or chickenpox.  You have a very bad headache.  You have shortness of breath.  You have any kind of trauma, such as from a fall or a car accident. Summary  The first trimester of pregnancy is from week 1 until the end of week 13 (months 1 through 3).  To take care of yourself and your unborn baby, you will need to eat healthy meals, take medicines only if your doctor tells you to do so, and do activities that are safe for you and your baby.  Keep all follow-up visits as told by your doctor. This is important as your doctor will have to ensure that your baby is healthy and growing well. This information is not intended to replace advice given to you by your health care provider. Make sure you discuss any questions you have with your health care provider. Document Revised: 07/09/2018 Document Reviewed: 03/26/2016 Elsevier Patient Education  2020 [employer]  

## 2019-12-08 ENCOUNTER — Encounter: Payer: Medicaid Other | Admitting: Advanced Practice Midwife

## 2020-03-07 ENCOUNTER — Ambulatory Visit (INDEPENDENT_AMBULATORY_CARE_PROVIDER_SITE_OTHER): Payer: BC Managed Care – PPO

## 2020-03-07 ENCOUNTER — Other Ambulatory Visit: Payer: Self-pay

## 2020-03-07 DIAGNOSIS — O0990 Supervision of high risk pregnancy, unspecified, unspecified trimester: Secondary | ICD-10-CM | POA: Diagnosis not present

## 2020-03-07 LAB — POCT URINE PREGNANCY: Preg Test, Ur: POSITIVE — AB

## 2020-03-07 MED ORDER — VITAFOL ULTRA 29-0.6-0.4-200 MG PO CAPS: 1.0000 | ORAL_CAPSULE | Freq: Every day | ORAL | 11 refills | Status: DC

## 2020-03-07 MED ORDER — BLOOD PRESSURE KIT DEVI: 1.0000 | 0 refills | Status: AC

## 2020-03-07 NOTE — Progress Notes (Signed)
Michelle Taylor presents today for UPT. She has no unusual complaints.  LMP: 01/20/2020    OBJECTIVE: Appears well, in no apparent distress.  OB History    Gravida  3   Para  1   Term      Preterm  1   AB  1   Living  1     SAB      TAB      Ectopic      Multiple  0   Live Births  1          Home UPT Result: POSITIVE In-Office UPT result: POSITIVE  I have reviewed the patient's medical, obstetrical, social, and family histories, and medications.   ASSESSMENT: Positive pregnancy test  PLAN Prenatal care to be completed at: [facility]

## 2020-03-07 NOTE — Progress Notes (Addendum)
PRENATAL INTAKE SUMMARY  [name] presents today New OB Nurse Interview.  OB History    Gravida  3   Para  1   Term      Preterm  1   AB  1   Living  1     SAB      TAB      Ectopic      Multiple  0   Live Births  1          I have reviewed the patient's medical, obstetrical, social, and family histories, medications, and available lab results.  SUBJECTIVE She has no unusual complaints  OBJECTIVE Initial  (New OB) Intake  GENERAL APPEARANCE: alert, well appearing   ASSESSMENT High Risk Pregnancy Hx Preterm delivery Hx PHTN  PLAN Prenatal care @[facility]  OB Pnl/HIV will be done at [facility] visit BP Cuff ordered PNV Rx Baby Scripts dowloaded [facility] appt 04/11/2020 with Dr. 06/09/2020

## 2020-04-01 NOTE — L&D Delivery Note (Signed)
Delivery Note At 4:10 AM a viable female was delivered via Vaginal, Spontaneous (Presentation:   Occiput Anterior).  APGAR: 8, 9; weight  pending.   Placenta status: Spontaneous, Intact.  Cord: 3 vessels with the following complications: None.  Cord pH: None  Called to room and patient was complete and pushing; after 3 contractions patient delivered over an intact perineum. Cord was wrapped around shoulder and body, delivered through. Spontaneous cry; infant placed on mother's abdomen, dried and suctioned. FOB cut cord at 1 min.   Anesthesia: Epidural Episiotomy: None Lacerations: Right Periurethral Suture Repair:  Not repaired, hemostati Est. Blood Loss (mL): 50  Mom to postpartum.  Baby to Couplet care / Skin to Skin.  Charlesetta Garibaldi Raju Coppolino 10/22/2020, 4:33 AM

## 2020-04-04 ENCOUNTER — Ambulatory Visit (INDEPENDENT_AMBULATORY_CARE_PROVIDER_SITE_OTHER): Payer: Medicaid Other

## 2020-04-04 ENCOUNTER — Other Ambulatory Visit: Payer: Self-pay

## 2020-04-04 DIAGNOSIS — O3680X0 Pregnancy with inconclusive fetal viability, not applicable or unspecified: Secondary | ICD-10-CM

## 2020-04-04 DIAGNOSIS — Z789 Other specified health status: Secondary | ICD-10-CM

## 2020-04-04 DIAGNOSIS — O0990 Supervision of high risk pregnancy, unspecified, unspecified trimester: Secondary | ICD-10-CM

## 2020-04-04 DIAGNOSIS — Z3A09 9 weeks gestation of pregnancy: Secondary | ICD-10-CM | POA: Diagnosis not present

## 2020-04-04 NOTE — Progress Notes (Signed)
PRENATAL INTAKE SUMMARY  Ms. Shevlin presents today New OB Nurse Interview.  OB History    Gravida  3   Para  1   Term      Preterm  1   AB  1   Living  1     SAB      IAB      Ectopic      Multiple  0   Live Births  1          I have reviewed the patient's medical, obstetrical, social, and family histories, medications, and available lab results.  SUBJECTIVE She has no unusual complaints  OBJECTIVE Initial Physical Exam (New OB)  GENERAL APPEARANCE: alert, well appearing   ASSESSMENT Normal pregnancy  PLAN Prenatal care to be completed at [facility] All New OB labs to be completed at [facility] provider visit Patient has BP cuff Baby Rx downloaded U/S performed today reveals single live IUP at [redacted]w[redacted]d by CRL. FHR 173 PHQ2 score:0 GAD 7 score:

## 2020-04-11 ENCOUNTER — Telehealth: Payer: Self-pay

## 2020-04-11 ENCOUNTER — Encounter: Payer: Self-pay | Admitting: Obstetrics and Gynecology

## 2020-04-11 ENCOUNTER — Other Ambulatory Visit (HOSPITAL_COMMUNITY)
Admission: RE | Admit: 2020-04-11 | Discharge: 2020-04-11 | Disposition: A | Discharge status: Home / Self Care | Payer: Medicaid Other | Source: Ambulatory Visit | Attending: Obstetrics & Gynecology | Admitting: Obstetrics & Gynecology

## 2020-04-11 ENCOUNTER — Other Ambulatory Visit: Payer: Self-pay

## 2020-04-11 ENCOUNTER — Ambulatory Visit (INDEPENDENT_AMBULATORY_CARE_PROVIDER_SITE_OTHER): Payer: Medicaid Other | Admitting: Obstetrics and Gynecology

## 2020-04-11 VITALS — BP 134/82 | HR 112 | Wt 175.1 lb

## 2020-04-11 DIAGNOSIS — O0990 Supervision of high risk pregnancy, unspecified, unspecified trimester: Secondary | ICD-10-CM | POA: Insufficient documentation

## 2020-04-11 DIAGNOSIS — Z8632 Personal history of gestational diabetes: Secondary | ICD-10-CM

## 2020-04-11 DIAGNOSIS — O09899 Supervision of other high risk pregnancies, unspecified trimester: Secondary | ICD-10-CM | POA: Insufficient documentation

## 2020-04-11 DIAGNOSIS — O99210 Obesity complicating pregnancy, unspecified trimester: Secondary | ICD-10-CM | POA: Insufficient documentation

## 2020-04-11 DIAGNOSIS — O09299 Supervision of pregnancy with other poor reproductive or obstetric history, unspecified trimester: Secondary | ICD-10-CM | POA: Insufficient documentation

## 2020-04-11 MED ORDER — ASPIRIN EC 81 MG PO TBEC: 81.0000 mg | DELAYED_RELEASE_TABLET | Freq: Every day | ORAL | 2 refills | Status: DC

## 2020-04-11 NOTE — Patient Instructions (Signed)
 Obstetrics: Normal and Problem Pregnancies (7th ed., pp. 102-121). [place], [place]: [employer]."> Textbook of Family Medicine (9th ed., pp. 365-410). [place], [place]: [employer].">  First Trimester of Pregnancy  The first trimester of pregnancy starts on the first day of your last menstrual period until the end of week 12. This is months 1 through 3 of pregnancy. A week after a sperm fertilizes an egg, the egg will implant into the wall of the uterus and begin to develop into a baby. By the end of 12 weeks, all the baby's organs will be formed and the baby will be 2-3 inches in size. Body changes during your first trimester Your body goes through many changes during pregnancy. The changes vary and generally return to normal after your baby is born. Physical changes  You may gain or lose weight.  Your breasts may begin to grow larger and become tender. The tissue that surrounds your nipples (areola) may become darker.  Dark spots or blotches (chloasma or mask of pregnancy) may develop on your face.  You may have changes in your hair. These can include thickening or thinning of your hair or changes in texture. Health changes  You may feel nauseous, and you may vomit.  You may have heartburn.  You may develop headaches.  You may develop constipation.  Your gums may bleed and may be sensitive to brushing and flossing. Other changes  You may tire easily.  You may urinate more often.  Your menstrual periods will stop.  You may have a loss of appetite.  You may develop cravings for certain kinds of food.  You may have changes in your emotions from day to day.  You may have more vivid and strange dreams. Follow these instructions at home: Medicines  Follow your health care provider's instructions regarding medicine use. Specific medicines may be either safe or unsafe to take during pregnancy. Do not take any medicines unless told to by your health care provider.  Take  a prenatal vitamin that contains at least 600 micrograms (mcg) of folic acid. Eating and drinking  Eat a healthy diet that includes fresh fruits and vegetables, whole grains, good sources of protein such as meat, eggs, or tofu, and low-fat dairy products.  Avoid raw meat and unpasteurized juice, milk, and cheese. These carry germs that can harm you and your baby.  If you feel nauseous or you vomit: ? Eat 4 or 5 small meals a day instead of 3 large meals. ? Try eating a few soda crackers. ? Drink liquids between meals instead of during meals.  You may need to take these actions to prevent or treat constipation: ? Drink enough fluid to keep your urine pale yellow. ? Eat foods that are high in fiber, such as beans, whole grains, and fresh fruits and vegetables. ? Limit foods that are high in fat and processed sugars, such as fried or sweet foods. Activity  Exercise only as directed by your health care provider. Most people can continue their usual exercise routine during pregnancy. Try to exercise for 30 minutes at least 5 days a week.  Stop exercising if you develop pain or cramping in the lower abdomen or lower back.  Avoid exercising if it is very hot or humid or if you are at high altitude.  Avoid heavy lifting.  If you choose to, you may have sex unless your health care provider tells you not to. Relieving pain and discomfort  Wear a good support bra to relieve   breast tenderness.  Rest with your legs elevated if you have leg cramps or low back pain.  If you develop bulging veins (varicose veins) in your legs: ? Wear support hose as told by your health care provider. ? Elevate your feet for 15 minutes, 3-4 times a day. ? Limit salt in your diet. Safety  Wear your seat belt at all times when driving or riding in a car.  Talk with your health care provider if someone is verbally or physically abusive to you.  Talk with your health care provider if you are feeling sad or have  thoughts of hurting yourself. Lifestyle  Do not use hot tubs, steam rooms, or saunas.  Do not douche. Do not use tampons or scented sanitary pads.  Do not use herbal remedies, alcohol, illegal drugs, or medicines that are not approved by your health care provider. Chemicals in these products can harm your baby.  Do not use any products that contain nicotine or tobacco, such as cigarettes, e-cigarettes, and chewing tobacco. If you need help quitting, ask your health care provider.  Avoid cat litter boxes and soil used by cats. These carry germs that can cause birth defects in the baby and possibly loss of the unborn baby (fetus) by miscarriage or stillbirth. General instructions  During routine prenatal visits in the first trimester, your health care provider will do a physical exam, perform necessary tests, and ask you how things are going. Keep all follow-up visits. This is important.  Ask for help if you have counseling or nutritional needs during pregnancy. Your health care provider can offer advice or refer you to specialists for help with various needs.  Schedule a dentist appointment. At home, brush your teeth with a soft toothbrush. Floss gently.  Write down your questions. Take them to your prenatal visits. Where to find more information  American Pregnancy Association: americanpregnancy.org  American College of Obstetricians and Gynecologists: acog.org/en/Womens%20Health/Pregnancy  Office on Women's Health: womenshealth.gov/pregnancy Contact a health care provider if you have:  Dizziness.  A fever.  Mild pelvic cramps, pelvic pressure, or nagging pain in the abdominal area.  Nausea, vomiting, or diarrhea that lasts for 24 hours or longer.  A bad-smelling vaginal discharge.  Pain when you urinate.  Known exposure to a contagious illness, such as chickenpox, measles, Zika virus, HIV, or hepatitis. Get help right away if you have:  Spotting or bleeding from your  vagina.  Severe abdominal cramping or pain.  Shortness of breath or chest pain.  Any kind of trauma, such as from a fall or a car crash.  New or increased pain, swelling, or redness in an arm or leg. Summary  The first trimester of pregnancy starts on the first day of your last menstrual period until the end of week 12 (months 1 through 3).  Eating 4 or 5 small meals a day rather than 3 large meals may help to relieve nausea and vomiting.  Do not use any products that contain nicotine or tobacco, such as cigarettes, e-cigarettes, and chewing tobacco. If you need help quitting, ask your health care provider.  Keep all follow-up visits. This is important. This information is not intended to replace advice given to you by your health care provider. Make sure you discuss any questions you have with your health care provider. Document Revised: 08/25/2019 Document Reviewed: 07/01/2019 [employer] Patient Education  2021 [employer]   Contraception Choices Contraception, also called birth control, refers to methods or devices that prevent pregnancy. Hormonal   methods Contraceptive implant A contraceptive implant is a thin, plastic tube that contains a hormone that prevents pregnancy. It is different from an intrauterine device (IUD). It is inserted into the upper part of the arm by a health care provider. Implants can be effective for up to 3 years. Progestin-only injections Progestin-only injections are injections of progestin, a synthetic form of the hormone progesterone. They are given every 3 months by a health care provider. Birth control pills Birth control pills are pills that contain hormones that prevent pregnancy. They must be taken once a day, preferably at the same time each day. A prescription is needed to use this method of contraception. Birth control patch The birth control patch contains hormones that prevent pregnancy. It is placed on the skin and must be changed once a  week for three weeks and removed on the fourth week. A prescription is needed to use this method of contraception. Vaginal ring A vaginal ring contains hormones that prevent pregnancy. It is placed in the vagina for three weeks and removed on the fourth week. After that, the process is repeated with a new ring. A prescription is needed to use this method of contraception. Emergency contraceptive Emergency contraceptives prevent pregnancy after unprotected sex. They come in pill form and can be taken up to 5 days after sex. They work best the sooner they are taken after having sex. Most emergency contraceptives are available without a prescription. This method should not be used as your only form of birth control.   Barrier methods Female condom A female condom is a thin sheath that is worn over the penis during sex. Condoms keep sperm from going inside a woman's body. They can be used with a sperm-killing substance (spermicide) to increase their effectiveness. They should be thrown away after one use. Female condom A female condom is a soft, loose-fitting sheath that is put into the vagina before sex. The condom keeps sperm from going inside a woman's body. They should be thrown away after one use. Diaphragm A diaphragm is a soft, dome-shaped barrier. It is inserted into the vagina before sex, along with a spermicide. The diaphragm blocks sperm from entering the uterus, and the spermicide kills sperm. A diaphragm should be left in the vagina for 6-8 hours after sex and removed within 24 hours. A diaphragm is prescribed and fitted by a health care provider. A diaphragm should be replaced every 1-2 years, after giving birth, after gaining more than 15 lb (6.8 kg), and after pelvic surgery. Cervical cap A cervical cap is a round, soft latex or plastic cup that fits over the cervix. It is inserted into the vagina before sex, along with spermicide. It blocks sperm from entering the uterus. The cap should be  left in place for 6-8 hours after sex and removed within 48 hours. A cervical cap must be prescribed and fitted by a health care provider. It should be replaced every 2 years. Sponge A sponge is a soft, circular piece of polyurethane foam with spermicide in it. The sponge helps block sperm from entering the uterus, and the spermicide kills sperm. To use it, you make it wet and then insert it into the vagina. It should be inserted before sex, left in for at least 6 hours after sex, and removed and thrown away within 30 hours. Spermicides Spermicides are chemicals that kill or block sperm from entering the cervix and uterus. They can come as a cream, jelly, suppository, foam, or tablet. A spermicide   should be inserted into the vagina with an applicator at least 10-15 minutes before sex to allow time for it to work. The process must be repeated every time you have sex. Spermicides do not require a prescription.   Intrauterine contraception Intrauterine device (IUD) An IUD is a T-shaped device that is put in a woman's uterus. There are two types:  Hormone IUD.This type contains progestin, a synthetic form of the hormone progesterone. This type can stay in place for 3-5 years.  Copper IUD.This type is wrapped in copper wire. It can stay in place for 10 years. Permanent methods of contraception Female tubal ligation In this method, a woman's fallopian tubes are sealed, tied, or blocked during surgery to prevent eggs from traveling to the uterus. Hysteroscopic sterilization In this method, a small, flexible insert is placed into each fallopian tube. The inserts cause scar tissue to form in the fallopian tubes and block them, so sperm cannot reach an egg. The procedure takes about 3 months to be effective. Another form of birth control must be used during those 3 months. Female sterilization This is a procedure to tie off the tubes that carry sperm (vasectomy). After the procedure, the man can still  ejaculate fluid (semen). Another form of birth control must be used for 3 months after the procedure. Natural planning methods Natural family planning In this method, a couple does not have sex on days when the woman could become pregnant. Calendar method In this method, the woman keeps track of the length of each menstrual cycle, identifies the days when pregnancy can happen, and does not have sex on those days. Ovulation method In this method, a couple avoids sex during ovulation. Symptothermal method This method involves not having sex during ovulation. The woman typically checks for ovulation by watching changes in her temperature and in the consistency of cervical mucus. Post-ovulation method In this method, a couple waits to have sex until after ovulation. Where to find more information  Centers for Disease Control and Prevention: www.cdc.gov Summary  Contraception, also called birth control, refers to methods or devices that prevent pregnancy.  Hormonal methods of contraception include implants, injections, pills, patches, vaginal rings, and emergency contraceptives.  Barrier methods of contraception can include female condoms, female condoms, diaphragms, cervical caps, sponges, and spermicides.  There are two types of IUDs (intrauterine devices). An IUD can be put in a woman's uterus to prevent pregnancy for 3-5 years.  Permanent sterilization can be done through a procedure for males and females. Natural family planning methods involve nothaving sex on days when the woman could become pregnant. This information is not intended to replace advice given to you by your health care provider. Make sure you discuss any questions you have with your health care provider. Document Revised: 08/23/2019 Document Reviewed: 08/23/2019 Elsevier Patient Education  2021 [employer]   Breastfeeding  Choosing to breastfeed is one of the best decisions you can make for yourself and your baby. A  change in hormones during pregnancy causes your breasts to make breast milk in your milk-producing glands. Hormones prevent breast milk from being released before your baby is born. They also prompt milk flow after birth. Once breastfeeding has begun, thoughts of your baby, as well as his or her sucking or crying, can stimulate the release of milk from your milk-producing glands. Benefits of breastfeeding Research shows that breastfeeding offers many health benefits for infants and mothers. It also offers a cost-free and convenient way to feed your baby.   For your baby  Your first milk (colostrum) helps your baby's digestive system to function better.  Special cells in your milk (antibodies) help your baby to fight off infections.  Breastfed babies are less likely to develop asthma, allergies, obesity, or type 2 diabetes. They are also at lower risk for sudden infant death syndrome (SIDS).  Nutrients in breast milk are better able to meet your baby's needs compared to infant formula.  Breast milk improves your baby's brain development. For you  Breastfeeding helps to create a very special bond between you and your baby.  Breastfeeding is convenient. Breast milk costs nothing and is always available at the correct temperature.  Breastfeeding helps to burn calories. It helps you to lose the weight that you gained during pregnancy.  Breastfeeding makes your uterus return faster to its size before pregnancy. It also slows bleeding (lochia) after you give birth.  Breastfeeding helps to lower your risk of developing type 2 diabetes, osteoporosis, rheumatoid arthritis, cardiovascular disease, and breast, ovarian, uterine, and endometrial cancer later in life. Breastfeeding basics Starting breastfeeding  Find a comfortable place to sit or lie down, with your neck and back well-supported.  Place a pillow or a rolled-up blanket under your baby to bring him or her to the level of your breast (if  you are seated). Nursing pillows are specially designed to help support your arms and your baby while you breastfeed.  Make sure that your baby's tummy (abdomen) is facing your abdomen.  Gently massage your breast. With your fingertips, massage from the outer edges of your breast inward toward the nipple. This encourages milk flow. If your milk flows slowly, you may need to continue this action during the feeding.  Support your breast with 4 fingers underneath and your thumb above your nipple (make the letter "C" with your hand). Make sure your fingers are well away from your nipple and your baby's mouth.  Stroke your baby's lips gently with your finger or nipple.  When your baby's mouth is open wide enough, quickly bring your baby to your breast, placing your entire nipple and as much of the areola as possible into your baby's mouth. The areola is the colored area around your nipple. ? More areola should be visible above your baby's upper lip than below the lower lip. ? Your baby's lips should be opened and extended outward (flanged) to ensure an adequate, comfortable latch. ? Your baby's tongue should be between his or her lower gum and your breast.  Make sure that your baby's mouth is correctly positioned around your nipple (latched). Your baby's lips should create a seal on your breast and be turned out (everted).  It is common for your baby to suck about 2-3 minutes in order to start the flow of breast milk. Latching Teaching your baby how to latch onto your breast properly is very important. An improper latch can cause nipple pain, decreased milk supply, and poor weight gain in your baby. Also, if your baby is not latched onto your nipple properly, he or she may swallow some air during feeding. This can make your baby fussy. Burping your baby when you switch breasts during the feeding can help to get rid of the air. However, teaching your baby to latch on properly is still the best way to  prevent fussiness from swallowing air while breastfeeding. Signs that your baby has successfully latched onto your nipple  Silent tugging or silent sucking, without causing you pain. Infant's lips should be   extended outward (flanged).  Swallowing heard between every 3-4 sucks once your milk has started to flow (after your let-down milk reflex occurs).  Muscle movement above and in front of his or her ears while sucking. Signs that your baby has not successfully latched onto your nipple  Sucking sounds or smacking sounds from your baby while breastfeeding.  Nipple pain. If you think your baby has not latched on correctly, slip your finger into the corner of your baby's mouth to break the suction and place it between your baby's gums. Attempt to start breastfeeding again. Signs of successful breastfeeding Signs from your baby  Your baby will gradually decrease the number of sucks or will completely stop sucking.  Your baby will fall asleep.  Your baby's body will relax.  Your baby will retain a small amount of milk in his or her mouth.  Your baby will let go of your breast by himself or herself. Signs from you  Breasts that have increased in firmness, weight, and size 1-3 hours after feeding.  Breasts that are softer immediately after breastfeeding.  Increased milk volume, as well as a change in milk consistency and color by the fifth day of breastfeeding.  Nipples that are not sore, cracked, or bleeding. Signs that your baby is getting enough milk  Wetting at least 1-2 diapers during the first 24 hours after birth.  Wetting at least 5-6 diapers every 24 hours for the first week after birth. The urine should be clear or pale yellow by the age of 5 days.  Wetting 6-8 diapers every 24 hours as your baby continues to grow and develop.  At least 3 stools in a 24-hour period by the age of 5 days. The stool should be soft and yellow.  At least 3 stools in a 24-hour period by the  age of 7 days. The stool should be seedy and yellow.  No loss of weight greater than 10% of birth weight during the first 3 days of life.  Average weight gain of 4-7 oz (113-198 g) per week after the age of 4 days.  Consistent daily weight gain by the age of 5 days, without weight loss after the age of 2 weeks. After a feeding, your baby may spit up a small amount of milk. This is normal. Breastfeeding frequency and duration Frequent feeding will help you make more milk and can prevent sore nipples and extremely full breasts (breast engorgement). Breastfeed when you feel the need to reduce the fullness of your breasts or when your baby shows signs of hunger. This is called "breastfeeding on demand." Signs that your baby is hungry include:  Increased alertness, activity, or restlessness.  Movement of the head from side to side.  Opening of the mouth when the corner of the mouth or cheek is stroked (rooting).  Increased sucking sounds, smacking lips, cooing, sighing, or squeaking.  Hand-to-mouth movements and sucking on fingers or hands.  Fussing or crying. Avoid introducing a pacifier to your baby in the first 4-6 weeks after your baby is born. After this time, you may choose to use a pacifier. Research has shown that pacifier use during the first year of a baby's life decreases the risk of sudden infant death syndrome (SIDS). Allow your baby to feed on each breast as long as he or she wants. When your baby unlatches or falls asleep while feeding from the first breast, offer the second breast. Because newborns are often sleepy in the first few weeks of   life, you may need to awaken your baby to get him or her to feed. Breastfeeding times will vary from baby to baby. However, the following rules can serve as a guide to help you make sure that your baby is properly fed:  Newborns (babies 4 weeks of age or younger) may breastfeed every 1-3 hours.  Newborns should not go without breastfeeding  for longer than 3 hours during the day or 5 hours during the night.  You should breastfeed your baby a minimum of 8 times in a 24-hour period. Breast milk pumping Pumping and storing breast milk allows you to make sure that your baby is exclusively fed your breast milk, even at times when you are unable to breastfeed. This is especially important if you go back to work while you are still breastfeeding, or if you are not able to be present during feedings. Your lactation consultant can help you find a method of pumping that works best for you and give you guidelines about how long it is safe to store breast milk.      Caring for your breasts while you breastfeed Nipples can become dry, cracked, and sore while breastfeeding. The following recommendations can help keep your breasts moisturized and healthy:  Avoid using soap on your nipples.  Wear a supportive bra designed especially for nursing. Avoid wearing underwire-style bras or extremely tight bras (sports bras).  Air-dry your nipples for 3-4 minutes after each feeding.  Use only cotton bra pads to absorb leaked breast milk. Leaking of breast milk between feedings is normal.  Use lanolin on your nipples after breastfeeding. Lanolin helps to maintain your skin's normal moisture barrier. Pure lanolin is not harmful (not toxic) to your baby. You may also hand express a few drops of breast milk and gently massage that milk into your nipples and allow the milk to air-dry. In the first few weeks after giving birth, some women experience breast engorgement. Engorgement can make your breasts feel heavy, warm, and tender to the touch. Engorgement peaks within 3-5 days after you give birth. The following recommendations can help to ease engorgement:  Completely empty your breasts while breastfeeding or pumping. You may want to start by applying warm, moist heat (in the shower or with warm, water-soaked hand towels) just before feeding or pumping. This  increases circulation and helps the milk flow. If your baby does not completely empty your breasts while breastfeeding, pump any extra milk after he or she is finished.  Apply ice packs to your breasts immediately after breastfeeding or pumping, unless this is too uncomfortable for you. To do this: ? Put ice in a plastic bag. ? Place a towel between your skin and the bag. ? Leave the ice on for 20 minutes, 2-3 times a day.  Make sure that your baby is latched on and positioned properly while breastfeeding. If engorgement persists after 48 hours of following these recommendations, contact your health care provider or a lactation consultant. Overall health care recommendations while breastfeeding  Eat 3 healthy meals and 3 snacks every day. Well-nourished mothers who are breastfeeding need an additional 450-500 calories a day. You can meet this requirement by increasing the amount of a balanced diet that you eat.  Drink enough water to keep your urine pale yellow or clear.  Rest often, relax, and continue to take your prenatal vitamins to prevent fatigue, stress, and low vitamin and mineral levels in your body (nutrient deficiencies).  Do not use any products that contain   nicotine or tobacco, such as cigarettes and e-cigarettes. Your baby may be harmed by chemicals from cigarettes that pass into breast milk and exposure to secondhand smoke. If you need help quitting, ask your health care provider.  Avoid alcohol.  Do not use illegal drugs or marijuana.  Talk with your health care provider before taking any medicines. These include over-the-counter and prescription medicines as well as vitamins and herbal supplements. Some medicines that may be harmful to your baby can pass through breast milk.  It is possible to become pregnant while breastfeeding. If birth control is desired, ask your health care provider about options that will be safe while breastfeeding your baby. Where to find more  information: [employer]: www.llli.org Contact a health care provider if:  You feel like you want to stop breastfeeding or have become frustrated with breastfeeding.  Your nipples are cracked or bleeding.  Your breasts are red, tender, or warm.  You have: ? Painful breasts or nipples. ? A swollen area on either breast. ? A fever or chills. ? Nausea or vomiting. ? Drainage other than breast milk from your nipples.  Your breasts do not become full before feedings by the fifth day after you give birth.  You feel sad and depressed.  Your baby is: ? Too sleepy to eat well. ? Having trouble sleeping. ? More than 1 week old and wetting fewer than 6 diapers in a 24-hour period. ? Not gaining weight by 5 days of age.  Your baby has fewer than 3 stools in a 24-hour period.  Your baby's skin or the white parts of his or her eyes become yellow. Get help right away if:  Your baby is overly tired (lethargic) and does not want to wake up and feed.  Your baby develops an unexplained fever. Summary  Breastfeeding offers many health benefits for infant and mothers.  Try to breastfeed your infant when he or she shows early signs of hunger.  Gently tickle or stroke your baby's lips with your finger or nipple to allow the baby to open his or her mouth. Bring the baby to your breast. Make sure that much of the areola is in your baby's mouth. Offer one side and burp the baby before you offer the other side.  Talk with your health care provider or lactation consultant if you have questions or you face problems as you breastfeed. This information is not intended to replace advice given to you by your health care provider. Make sure you discuss any questions you have with your health care provider. Document Revised: 06/12/2017 Document Reviewed: 04/19/2016 Elsevier Patient Education  2021 Elsevier Inc.  

## 2020-04-11 NOTE — Progress Notes (Signed)
   Subjective:    Michelle Taylor is a [id number] [redacted]w[redacted]d being seen today for her first obstetrical visit.  Her obstetrical history is significant for obesity, pre-eclampsia and gestational diabetes diet controlled. Patient does intend to breast feed. Pregnancy history fully reviewed.  Patient reports no complaints.  Vitals:   04/11/20 1021  BP: 134/82  Pulse: (!) 112  Weight: 175 lb 1.6 oz (79.4 kg)    HISTORY: OB History  Gravida Para Term Preterm AB Living  3 1   1 1 1   SAB IAB Ectopic Multiple Live Births        0 1    # Outcome Date GA Lbr Len/2nd Weight Sex Delivery Anes PTL Lv  3 Current           2 Preterm 04/07/18 [redacted]w[redacted]d 07:45 / 00:16 3 lb 7.7 oz (1.58 kg) F Vag-Vacuum EPI  LIV  1 AB            Past Medical History:  Diagnosis Date  . Chlamydia 2008  . Gestational diabetes   . Pregnancy induced hypertension   . Preterm labor   . Vaginal Pap smear, abnormal   . Yeast infection    Past Surgical History:  Procedure Laterality Date  . PILONIDAL CYST EXCISION    . WISDOM TOOTH EXTRACTION     Family History  Problem Relation Age of Onset  . Hypertension Paternal Grandmother   . Hypertension Paternal Grandfather   . Asthma Mother   . Asthma Brother      Exam    Uterus:     Pelvic Exam:    Perineum: Normal Perineum   Vulva: normal   Vagina:  normal mucosa, normal discharge   pH:    Cervix: multiparous appearance and cervix is closed and long   Adnexa: no mass, fullness, tenderness   Bony Pelvis: gynecoid  System: Breast:  normal appearance, no masses or tenderness   Skin: normal coloration and turgor, no rashes    Neurologic: oriented, no focal deficits   Extremities: normal strength, tone, and muscle mass   HEENT extra ocular movement intact   Mouth/Teeth mucous membranes moist, pharynx normal without lesions and dental hygiene good   Neck supple and no masses   Cardiovascular: regular rate and rhythm   Respiratory:  appears well, vitals normal, no  respiratory distress, acyanotic, normal RR, chest clear, no wheezing, crepitations, rhonchi, normal symmetric air entry   Abdomen: soft, non-tender; bowel sounds normal; no masses,  no organomegaly   Urinary:       Assessment:    Pregnancy: 2009 Patient Active Problem List   Diagnosis Date Noted  . History of preterm delivery, currently pregnant 04/11/2020  . History of pre-eclampsia in prior pregnancy, currently pregnant 04/11/2020  . History of gestational diabetes in prior pregnancy, currently pregnant 04/11/2020  . Maternal obesity affecting pregnancy, antepartum 04/11/2020  . Supervision of high risk pregnancy, antepartum 11/03/2019  . Prediabetes 11/17/2017        Plan:     Initial labs drawn. Prenatal vitamins. Problem list reviewed and updated. Genetic Screening discussed : panorama ordered.  Ultrasound discussed; fetal survey: ordered. Discussed starting ASA after 12 weeks Baseline labs ordered  Follow up in 4 weeks. 50% of 30 min visit spent on counseling and coordination of care.     Gailen Venne 04/11/2020

## 2020-04-11 NOTE — Telephone Encounter (Signed)
Mailing patient new patient letter in the mail for [place] appointment in March.

## 2020-04-12 LAB — COMPREHENSIVE METABOLIC PANEL
ALT: 17 IU/L (ref 0–32)
AST: 14 IU/L (ref 0–40)
Albumin/Globulin Ratio: 1.1 — ABNORMAL LOW (ref 1.2–2.2)
Albumin: 4 g/dL (ref 3.9–5.0)
Alkaline Phosphatase: 73 IU/L (ref 44–121)
BUN/Creatinine Ratio: 9 (ref 9–23)
BUN: 5 mg/dL — ABNORMAL LOW (ref 6–20)
Bilirubin Total: 0.3 mg/dL (ref 0.0–1.2)
CO2: 19 mmol/L — ABNORMAL LOW (ref 20–29)
Calcium: 9.8 mg/dL (ref 8.7–10.2)
Chloride: 102 mmol/L (ref 96–106)
Creatinine, Ser: 0.53 mg/dL — ABNORMAL LOW (ref 0.57–1.00)
GFR calc Af Amer: 148 mL/min/{1.73_m2} (ref >59)
GFR calc non Af Amer: 129 mL/min/{1.73_m2} (ref >59)
Globulin, Total: 3.7 g/dL (ref 1.5–4.5)
Glucose: 89 mg/dL (ref 65–99)
Potassium: 3.8 mmol/L (ref 3.5–5.2)
Sodium: 135 mmol/L (ref 134–144)
Total Protein: 7.7 g/dL (ref 6.0–8.5)

## 2020-04-12 LAB — CBC/D/PLT+RPR+RH+ABO+RUB AB...
Antibody Screen: NEGATIVE
Basophils Absolute: 0 10*3/uL (ref 0.0–0.2)
Basos: 0 %
EOS (ABSOLUTE): 0 10*3/uL (ref 0.0–0.4)
Eos: 0 %
HCV Ab: <0.1 s/co ratio (ref 0.0–0.9)
HIV Screen 4th Generation wRfx: NONREACTIVE
Hematocrit: 36.3 % (ref 34.0–46.6)
Hemoglobin: 12 g/dL (ref 11.1–15.9)
Hepatitis B Surface Ag: NEGATIVE
Immature Grans (Abs): 0 10*3/uL (ref 0.0–0.1)
Immature Granulocytes: 0 %
Lymphocytes Absolute: 1.6 10*3/uL (ref 0.7–3.1)
Lymphs: 29 %
MCH: 29.7 pg (ref 26.6–33.0)
MCHC: 33.1 g/dL (ref 31.5–35.7)
MCV: 90 fL (ref 79–97)
Monocytes Absolute: 0.5 10*3/uL (ref 0.1–0.9)
Monocytes: 9 %
Neutrophils Absolute: 3.3 10*3/uL (ref 1.4–7.0)
Neutrophils: 62 %
Platelets: 303 10*3/uL (ref 150–450)
RBC: 4.04 x10E6/uL (ref 3.77–5.28)
RDW: 12.7 % (ref 11.7–15.4)
RPR Ser Ql: NONREACTIVE
Rh Factor: POSITIVE
Rubella Antibodies, IGG: 23.6 index (ref >0.99)
WBC: 5.5 10*3/uL (ref 3.4–10.8)

## 2020-04-12 LAB — HEMOGLOBIN A1C
Est. average glucose Bld gHb Est-mCnc: 126 mg/dL
Hgb A1c MFr Bld: 6 % — ABNORMAL HIGH (ref 4.8–5.6)

## 2020-04-12 LAB — PROTEIN / CREATININE RATIO, URINE
Creatinine, Urine: 210.8 mg/dL
Protein, Ur: 37.8 mg/dL
Protein/Creat Ratio: 179 mg/g creat (ref 0–200)

## 2020-04-12 LAB — HCV INTERPRETATION

## 2020-04-13 LAB — URINE CULTURE, OB REFLEX

## 2020-04-13 LAB — CERVICOVAGINAL ANCILLARY ONLY
Chlamydia: NEGATIVE
Comment: NEGATIVE
Comment: NORMAL
Neisseria Gonorrhea: NEGATIVE

## 2020-04-13 LAB — CYTOLOGY - PAP: Diagnosis: NEGATIVE

## 2020-04-13 LAB — CULTURE, OB URINE

## 2020-04-18 ENCOUNTER — Encounter: Payer: Self-pay | Admitting: Obstetrics and Gynecology

## 2020-04-21 ENCOUNTER — Encounter: Payer: Self-pay | Admitting: Obstetrics and Gynecology

## 2020-05-09 ENCOUNTER — Other Ambulatory Visit: Payer: Self-pay

## 2020-05-09 ENCOUNTER — Ambulatory Visit (INDEPENDENT_AMBULATORY_CARE_PROVIDER_SITE_OTHER): Payer: Medicaid Other | Admitting: Obstetrics and Gynecology

## 2020-05-09 VITALS — BP 108/72 | HR 102 | Wt 175.8 lb

## 2020-05-09 DIAGNOSIS — O09899 Supervision of other high risk pregnancies, unspecified trimester: Secondary | ICD-10-CM

## 2020-05-09 DIAGNOSIS — O0990 Supervision of high risk pregnancy, unspecified, unspecified trimester: Secondary | ICD-10-CM

## 2020-05-09 DIAGNOSIS — Z8632 Personal history of gestational diabetes: Secondary | ICD-10-CM

## 2020-05-09 DIAGNOSIS — O09299 Supervision of pregnancy with other poor reproductive or obstetric history, unspecified trimester: Secondary | ICD-10-CM

## 2020-05-09 DIAGNOSIS — Z3A14 14 weeks gestation of pregnancy: Secondary | ICD-10-CM | POA: Insufficient documentation

## 2020-05-09 MED ORDER — VITAFOL ULTRA 29-0.6-0.4-200 MG PO CAPS: 1.0000 | ORAL_CAPSULE | Freq: Every day | ORAL | 11 refills | Status: AC

## 2020-05-09 NOTE — Progress Notes (Signed)
   PRENATAL VISIT NOTE  Subjective:  Zera Markwardt is a 30 y.o. [phone] at [redacted]w[redacted]d being seen today for ongoing prenatal care.  She is currently monitored for the following issues for this high-risk pregnancy and has Prediabetes; Supervision of high risk pregnancy, antepartum; History of preterm delivery, currently pregnant; History of pre-eclampsia in prior pregnancy, currently pregnant; History of gestational diabetes in prior pregnancy, currently pregnant; Maternal obesity affecting pregnancy, antepartum; and [redacted] weeks gestation of pregnancy on their problem list.  Patient doing well with no acute concerns today. She reports no complaints.  Contractions: Not present. Vag. Bleeding: None.   . Denies leaking of fluid.   The following portions of the patient's history were reviewed and updated as appropriate: allergies, current medications, past family history, past medical history, past social history, past surgical history and problem list. Problem list updated.  Objective:   Vitals:   05/09/20 0814  BP: 108/72  Pulse: (!) 102  Weight: 175 lb 12.8 oz (79.7 kg)    Fetal Status: Fetal Heart Rate (bpm): 151         General:  Alert, oriented and cooperative. Patient is in no acute distress.  Skin: Skin is warm and dry. No rash noted.   Cardiovascular: Normal heart rate noted  Respiratory: Normal respiratory effort, no problems with respiration noted  Abdomen: Soft, gravid, appropriate for gestational age.  Pain/Pressure: Absent     Pelvic: Cervical exam deferred        Extremities: Normal range of motion.  Edema: None  Mental Status:  Normal mood and affect. Normal behavior. Normal judgment and thought content.   Assessment and Plan:  Pregnancy: [id number] at [redacted]w[redacted]d  1. Supervision of high risk pregnancy, antepartum Routine care - Prenat-Fe Poly-Methfol-FA-DHA (VITAFOL ULTRA) 29-0.6-0.4-200 MG CAPS; Take 1 capsule by mouth daily.  Dispense: 30 capsule; Refill: 11  2. History of preterm  delivery, currently pregnant Discussed with pt, she was delivered at 35 weeks due to preeclampsia not preterm labor or PPROM  3. History of pre-eclampsia in prior pregnancy, currently pregnant BP currently normal, compliant with baby ASA  4. History of gestational diabetes in prior pregnancy, currently pregnant   5. [redacted] weeks gestation of pregnancy   Preterm labor symptoms and general obstetric precautions including but not limited to vaginal bleeding, contractions, leaking of fluid and fetal movement were reviewed in detail with the patient.  Please refer to After Visit Summary for other counseling recommendations.   Return in about 4 weeks (around 06/06/2020) for [facility], in person.   Mariel Aloe, MD

## 2020-05-09 NOTE — Patient Instructions (Signed)

## 2020-06-04 ENCOUNTER — Inpatient Hospital Stay (HOSPITAL_COMMUNITY)
Admission: AD | Admit: 2020-06-04 | Discharge: 2020-06-04 | Disposition: A | Discharge status: Home / Self Care | Payer: Medicaid Other | Attending: Emergency Medicine | Admitting: Emergency Medicine

## 2020-06-04 ENCOUNTER — Encounter (HOSPITAL_COMMUNITY): Payer: Self-pay | Admitting: Emergency Medicine

## 2020-06-04 ENCOUNTER — Other Ambulatory Visit: Payer: Self-pay

## 2020-06-04 DIAGNOSIS — O132 Gestational [pregnancy-induced] hypertension without significant proteinuria, second trimester: Secondary | ICD-10-CM | POA: Diagnosis not present

## 2020-06-04 DIAGNOSIS — R197 Diarrhea, unspecified: Secondary | ICD-10-CM | POA: Diagnosis not present

## 2020-06-04 DIAGNOSIS — Z3A18 18 weeks gestation of pregnancy: Secondary | ICD-10-CM

## 2020-06-04 DIAGNOSIS — Z87891 Personal history of nicotine dependence: Secondary | ICD-10-CM | POA: Diagnosis not present

## 2020-06-04 DIAGNOSIS — Z79899 Other long term (current) drug therapy: Secondary | ICD-10-CM | POA: Diagnosis not present

## 2020-06-04 DIAGNOSIS — O211 Hyperemesis gravidarum with metabolic disturbance: Secondary | ICD-10-CM

## 2020-06-04 DIAGNOSIS — O219 Vomiting of pregnancy, unspecified: Secondary | ICD-10-CM | POA: Diagnosis not present

## 2020-06-04 DIAGNOSIS — Z7982 Long term (current) use of aspirin: Secondary | ICD-10-CM | POA: Diagnosis not present

## 2020-06-04 DIAGNOSIS — E860 Dehydration: Secondary | ICD-10-CM

## 2020-06-04 DIAGNOSIS — O24419 Gestational diabetes mellitus in pregnancy, unspecified control: Secondary | ICD-10-CM | POA: Diagnosis not present

## 2020-06-04 LAB — URINALYSIS, ROUTINE W REFLEX MICROSCOPIC
Bilirubin Urine: NEGATIVE
Glucose, UA: NEGATIVE mg/dL
Hgb urine dipstick: NEGATIVE
Ketones, ur: 80 mg/dL — AB
Leukocytes,Ua: NEGATIVE
Nitrite: NEGATIVE
Protein, ur: 30 mg/dL — AB
Specific Gravity, Urine: 1.029 (ref 1.005–1.030)
pH: 6 (ref 5.0–8.0)

## 2020-06-04 LAB — BASIC METABOLIC PANEL
Anion gap: 11 (ref 5–15)
BUN: 5 mg/dL — ABNORMAL LOW (ref 6–20)
CO2: 20 mmol/L — ABNORMAL LOW (ref 22–32)
Calcium: 9.6 mg/dL (ref 8.9–10.3)
Chloride: 102 mmol/L (ref 98–111)
Creatinine, Ser: 0.57 mg/dL (ref 0.44–1.00)
GFR, Estimated: >60 mL/min (ref >60)
Glucose, Bld: 86 mg/dL (ref 70–99)
Potassium: 4 mmol/L (ref 3.5–5.1)
Sodium: 133 mmol/L — ABNORMAL LOW (ref 135–145)

## 2020-06-04 LAB — CBC
HCT: 39 % (ref 36.0–46.0)
Hemoglobin: 12.8 g/dL (ref 12.0–15.0)
MCH: 30 pg (ref 26.0–34.0)
MCHC: 32.8 g/dL (ref 30.0–36.0)
MCV: 91.3 fL (ref 80.0–100.0)
Platelets: 355 10*3/uL (ref 150–400)
RBC: 4.27 MIL/uL (ref 3.87–5.11)
RDW: 13.4 % (ref 11.5–15.5)
WBC: 8.6 10*3/uL (ref 4.0–10.5)
nRBC: 0 % (ref 0.0–0.2)

## 2020-06-04 MED ORDER — FAMOTIDINE IN NACL 20-0.9 MG/50ML-% IV SOLN
20.0000 mg | Freq: Once | INTRAVENOUS | Status: AC
  Administered 2020-06-04: 20 mg via INTRAVENOUS
  Filled 2020-06-04: qty 50

## 2020-06-04 MED ORDER — ONDANSETRON 4 MG PO TBDP: 4.0000 mg | ORAL_TABLET | Freq: Three times a day (TID) | ORAL | 1 refills | Status: DC | PRN

## 2020-06-04 MED ORDER — M.V.I. ADULT IV INJ
Freq: Once | INTRAVENOUS | Status: AC
  Filled 2020-06-04: qty 10

## 2020-06-04 MED ORDER — LACTATED RINGERS IV BOLUS
1000.0000 mL | Freq: Once | INTRAVENOUS | Status: AC
  Administered 2020-06-04: 1000 mL via INTRAVENOUS

## 2020-06-04 MED ORDER — ONDANSETRON HCL 4 MG/2ML IJ SOLN
4.0000 mg | Freq: Once | INTRAMUSCULAR | Status: AC
  Administered 2020-06-04: 4 mg via INTRAVENOUS
  Filled 2020-06-04: qty 2

## 2020-06-04 NOTE — ED Notes (Signed)
Patient evaluated by EDP at triage , MAU notified by RN for transfer.

## 2020-06-04 NOTE — MAU Provider Note (Signed)
History     CSN: [id number]  Arrival date and time: 06/04/20 1857   Event Date/Time   First Provider Initiated Contact with Patient 06/04/20 2016      Chief Complaint  Patient presents with  . 18 weeks G3P1/Emesis    Michelle Taylor is a 30 y.o. G3P1 at 77w1dwho presents to MAU with complaints of nausea and emesis. Patient reports that child had stomach bug about 3 days ago and over the weekend. Patient believes that child gave her the stomach bug as she started having symptoms this morning. She reports symptoms as nausea, emesis x 3 since this morning and diarrhea x1. She reports that she feels dehydrated due to being unable to keep food or liquids down since symptoms started. Patient denies abdominal pain, vaginal bleeding or discharge. +FM.    OB History    Gravida  3   Para  1   Term      Preterm  1   AB  1   Living  1     SAB      IAB      Ectopic      Multiple  0   Live Births  1           Past Medical History:  Diagnosis Date  . Chlamydia 2008  . Gestational diabetes   . Pregnancy induced hypertension   . Preterm labor   . Vaginal Pap smear, abnormal   . Yeast infection     Past Surgical History:  Procedure Laterality Date  . PILONIDAL CYST EXCISION    . WISDOM TOOTH EXTRACTION      Family History  Problem Relation Age of Onset  . Hypertension Paternal Grandmother   . Hypertension Paternal Grandfather   . Asthma Mother   . Asthma Brother     Social History   Tobacco Use  . Smoking status: Former Smoker    Packs/day: 0.25    Types: Cigarettes  . Smokeless tobacco: Never Used  Vaping Use  . Vaping Use: Never used  Substance Use Topics  . Alcohol use: Not Currently  . Drug use: No    Allergies: No Known Allergies  Medications Prior to Admission  Medication Sig Dispense Refill Last Dose  . aspirin EC 81 MG tablet Take 1 tablet (81 mg total) by mouth daily. Take after 12 weeks for prevention of preeclampsia later in pregnancy  300 tablet 2 06/03/2020 at Unknown time  . Blood Pressure Monitoring (BLOOD PRESSURE KIT) DEVI 1 kit by Does not apply route once a week. Check Blood Pressure regularly and record readings into the Babyscripts App.  Large Cuff.  DX O90.0 1 each 0 06/03/2020 at Unknown time  . Prenat-Fe Poly-Methfol-FA-DHA (VITAFOL ULTRA) 29-0.6-0.4-200 MG CAPS Take 1 capsule by mouth daily. 30 capsule 11 06/03/2020 at Unknown time  . acetaminophen (TYLENOL) 500 MG tablet Take 1,000 mg by mouth every 6 (six) hours as needed for mild pain or headache.     . Misc. Devices (GOJJI WEIGHT SCALE) MISC 1 Device by Does not apply route daily as needed. (Patient not taking: No sig reported) 1 each 0     Review of Systems  Constitutional: Negative.   Respiratory: Negative.   Cardiovascular: Negative.   Gastrointestinal: Positive for diarrhea, nausea and vomiting. Negative for abdominal pain and constipation.  Genitourinary: Negative.   Musculoskeletal: Negative.   Neurological: Negative.   Psychiatric/Behavioral: Negative.    Physical Exam   Blood pressure 115/64, pulse (!) 127,  temperature 99.7 F (37.6 C), resp. rate 20, weight 79.1 kg, last menstrual period 01/20/2020, SpO2 98 %, unknown if currently breastfeeding.  Physical Exam Vitals and nursing note reviewed.  Cardiovascular:     Rate and Rhythm: Normal rate and regular rhythm.  Pulmonary:     Effort: Pulmonary effort is normal. No respiratory distress.     Breath sounds: Normal breath sounds. No wheezing.  Abdominal:     Palpations: Abdomen is soft. There is no mass.     Tenderness: There is no abdominal tenderness. There is no guarding.     Comments: Gravid appropriate for gestational age   Musculoskeletal:     Right lower leg: No edema.     Left lower leg: No edema.  Skin:    General: Skin is warm and dry.  Neurological:     Mental Status: She is alert and oriented to person, place, and time.  Psychiatric:        Mood and Affect: Mood normal.         Behavior: Behavior normal.        Thought Content: Thought content normal.     FHR 156 by doppler   MAU Course  Procedures  MDM Orders Placed This Encounter  Procedures  . Urinalysis, Routine w reflex microscopic Urine, Clean Catch  . CBC  . Basic metabolic panel  . Insert peripheral IV   Meds ordered this encounter  Medications  . lactated ringers bolus 1,000 mL  . ondansetron (ZOFRAN) injection 4 mg  . famotidine (PEPCID) IVPB 20 mg premix  . multivitamins adult (INFUVITE ADULT) 10 mL in dextrose 5% lactated ringers 1,000 mL infusion  . ondansetron (ZOFRAN ODT) 4 MG disintegrating tablet    Sig: Take 1 tablet (4 mg total) by mouth every 8 (eight) hours as needed for nausea or vomiting.    Dispense:  30 tablet    Refill:  1    Order Specific Question:   Supervising Provider    Answer:   Griffin Basil [[id number]]   UA reviewed:  Results for orders placed or performed during the hospital encounter of 06/04/20 (from the past 24 hour(s))  Urinalysis, Routine w reflex microscopic Urine, Clean Catch     Status: Abnormal   Collection Time: 06/04/20  7:45 PM  Result Value Ref Range   Color, Urine AMBER (A) YELLOW   APPearance HAZY (A) CLEAR   Specific Gravity, Urine 1.029 1.005 - 1.030   pH 6.0 5.0 - 8.0   Glucose, UA NEGATIVE NEGATIVE mg/dL   Hgb urine dipstick NEGATIVE NEGATIVE   Bilirubin Urine NEGATIVE NEGATIVE   Ketones, ur 80 (A) NEGATIVE mg/dL   Protein, ur 30 (A) NEGATIVE mg/dL   Nitrite NEGATIVE NEGATIVE   Leukocytes,Ua NEGATIVE NEGATIVE   RBC / HPF 0-5 0 - 5 RBC/hpf   WBC, UA 0-5 0 - 5 WBC/hpf   Bacteria, UA RARE (A) NONE SEEN   Squamous Epithelial / LPF 6-10 0 - 5   Mucus PRESENT   CBC     Status: None   Collection Time: 06/04/20  8:40 PM  Result Value Ref Range   WBC 8.6 4.0 - 10.5 K/uL   RBC 4.27 3.87 - 5.11 MIL/uL   Hemoglobin 12.8 12.0 - 15.0 g/dL   HCT 39.0 36.0 - 46.0 %   MCV 91.3 80.0 - 100.0 fL   MCH 30.0 26.0 - 34.0 pg   MCHC 32.8 30.0 - 36.0  g/dL   RDW 13.4 11.5 -  15.5 %   Platelets 355 150 - 400 K/uL   nRBC 0.0 0.0 - 0.2 %   80 ketones present on UA - dehydration. MVI infusion ordered in addition to LR bolus and zofran.   Reassessment _0 , patient reports that she is feeling much better, was able to keep down crackers and liquids. Has not had emesis in MAU. MVI halfway complete. Rx for zofran sent to pharmacy of choice.   Discussed reasons to return to MAU. Follow up as scheduled in the office. Return to MAU as needed. Pt stable at time of discharge.   Assessment and Plan   1. Nausea and vomiting during pregnancy   2. Dehydration during pregnancy   3. [redacted] weeks gestation of pregnancy    Discharge home Follow up as scheduled in the office for prenatal care Return to MAU as needed for reasons discussed and/or emergencies  Rx for zofran    Follow-up [place] Follow up.   Specialty: Obstetrics and Gynecology Contact information: [address] [place] [phone]             Allergies as of 06/04/2020   No Known Allergies     Medication List    TAKE these medications   acetaminophen 500 MG tablet Commonly known as: TYLENOL Take 1,000 mg by mouth every 6 (six) hours as needed for mild pain or headache.   aspirin EC 81 MG tablet Take 1 tablet (81 mg total) by mouth daily. Take after 12 weeks for prevention of preeclampsia later in pregnancy   Blood Pressure Kit Devi 1 kit by Does not apply route once a week. Check Blood Pressure regularly and record readings into the Babyscripts App.  Large Cuff.  DX O90.0   Gojji Weight Scale Misc 1 Device by Does not apply route daily as needed.   ondansetron 4 MG disintegrating tablet Commonly known as: Zofran ODT Take 1 tablet (4 mg total) by mouth every 8 (eight) hours as needed for nausea or vomiting.   Vitafol Ultra 29-0.6-0.4-200 MG Caps Take 1 capsule by mouth daily.        Lajean Manes CNM 06/04/2020, 10:40 PM

## 2020-06-04 NOTE — MAU Note (Signed)
Pt reports that she thinks she has a stomach bug. Pt reports she thinks her daughter passed it to her. Pt reports having cereal for breakfast and then vomiting it. Pt reports that after that she hasnt been able to drink or eat anything since. Pt reports being restless and unable to get comfortable.   Pt reports trying to drink water because she feels she is dehydrated and after she threw up and it looked green.   Pt called on call nurse and was told to come in.

## 2020-06-04 NOTE — ED Provider Notes (Signed)
Patient g3p1, [redacted] weeks pregnant w nv. Vomited x 2, diarrhea x 1. No abd pain. No vaginal bleeding or discharge. No dysuria or gu c/o. Daughter recently with nvd illness - ?possible viral syndrome.   No prior problems with recurrent vomiting this pregnancy or with prior pregnancy. Abd soft nt.   Discussed pt with MAU provider - she indicates to send over to MAU for them to evaluate.   Pt appears stable for movement to MAU.      Cathren Laine, MD 06/04/20 [phone]

## 2020-06-04 NOTE — ED Triage Notes (Signed)
Patient reports emesis with diarrhea today , denies abdominal pain or contractions , no vaginal bleeding or discharge .

## 2020-06-06 ENCOUNTER — Ambulatory Visit (INDEPENDENT_AMBULATORY_CARE_PROVIDER_SITE_OTHER): Payer: Medicaid Other | Admitting: Obstetrics and Gynecology

## 2020-06-06 ENCOUNTER — Encounter: Payer: Self-pay | Admitting: Obstetrics and Gynecology

## 2020-06-06 ENCOUNTER — Other Ambulatory Visit: Payer: Self-pay

## 2020-06-06 VITALS — BP 123/77 | HR 106 | Wt 177.0 lb

## 2020-06-06 DIAGNOSIS — O0990 Supervision of high risk pregnancy, unspecified, unspecified trimester: Secondary | ICD-10-CM

## 2020-06-06 DIAGNOSIS — O09299 Supervision of pregnancy with other poor reproductive or obstetric history, unspecified trimester: Secondary | ICD-10-CM

## 2020-06-06 DIAGNOSIS — Z8632 Personal history of gestational diabetes: Secondary | ICD-10-CM

## 2020-06-06 NOTE — Progress Notes (Signed)
   PRENATAL VISIT NOTE  Subjective:  Michelle Taylor is a 30 y.o. [phone] at [redacted]w[redacted]d being seen today for ongoing prenatal care.  She is currently monitored for the following issues for this high-risk pregnancy and has Prediabetes; Supervision of high risk pregnancy, antepartum; History of preterm delivery, currently pregnant; History of pre-eclampsia in prior pregnancy, currently pregnant; History of gestational diabetes in prior pregnancy, currently pregnant; Maternal obesity affecting pregnancy, antepartum; and [redacted] weeks gestation of pregnancy on their problem list.  Patient reports nausea.  Contractions: Not present. Vag. Bleeding: None.   . Denies leaking of fluid.   The following portions of the patient's history were reviewed and updated as appropriate: allergies, current medications, past family history, past medical history, past social history, past surgical history and problem list.   Objective:   Vitals:   06/06/20 1613  BP: 123/77  Pulse: (!) 106  Weight: 177 lb (80.3 kg)    Fetal Status: Fetal Heart Rate (bpm): 165         General:  Alert, oriented and cooperative. Patient is in no acute distress.  Skin: Skin is warm and dry. No rash noted.   Cardiovascular: Normal heart rate noted  Respiratory: Normal respiratory effort, no problems with respiration noted  Abdomen: Soft, gravid, appropriate for gestational age.  Pain/Pressure: Absent     Pelvic: Cervical exam deferred        Extremities: Normal range of motion.  Edema: None  Mental Status: Normal mood and affect. Normal behavior. Normal judgment and thought content.   Assessment and Plan:  Pregnancy: [id number] at [redacted]w[redacted]d 1. Supervision of high risk pregnancy, antepartum Patient is doing well without complaints Anatomy ultrasound scheduled for next week AFP today  2. History of gestational diabetes in prior pregnancy, currently pregnant Patient with A1c 6.0 Patient to come in this week for early glucola  3. History of  pre-eclampsia in prior pregnancy, currently pregnant Continue ASA   Preterm labor symptoms and general obstetric precautions including but not limited to vaginal bleeding, contractions, leaking of fluid and fetal movement were reviewed in detail with the patient. Please refer to After Visit Summary for other counseling recommendations.   Return in about 4 weeks (around 07/04/2020) for in person, ROB.  Future Appointments  Date Time Provider Department Center  06/12/2020 10:15 AM [facility] NURSE [facility]  06/12/2020 10:30 AM [facility] US3 [facility]    Catalina Antigua, MD

## 2020-06-06 NOTE — Progress Notes (Signed)
+   Fetal movement. Pt was seen at MAU for N/V. Rx for zofran sent in. Pt did receive fluids.

## 2020-06-08 ENCOUNTER — Other Ambulatory Visit: Payer: Medicaid Other

## 2020-06-08 ENCOUNTER — Other Ambulatory Visit: Payer: Self-pay

## 2020-06-08 DIAGNOSIS — O09299 Supervision of pregnancy with other poor reproductive or obstetric history, unspecified trimester: Secondary | ICD-10-CM

## 2020-06-08 DIAGNOSIS — O0990 Supervision of high risk pregnancy, unspecified, unspecified trimester: Secondary | ICD-10-CM

## 2020-06-08 LAB — AFP, SERUM, OPEN SPINA BIFIDA
AFP MoM: 0.74
AFP Value: 33.7 ng/mL
Gest. Age on Collection Date: 18.3 weeks
Maternal Age At EDD: 29.8 yr
OSBR Risk 1 IN: 10000
Test Results:: NEGATIVE
Weight: 177 [lb_av]

## 2020-06-09 LAB — GLUCOSE TOLERANCE, 2 HOURS W/ 1HR
Glucose, 1 hour: 98 mg/dL (ref 65–179)
Glucose, 2 hour: 85 mg/dL (ref 65–152)
Glucose, Fasting: 77 mg/dL (ref 65–91)

## 2020-06-12 ENCOUNTER — Encounter: Payer: Self-pay | Admitting: *Deleted

## 2020-06-12 ENCOUNTER — Other Ambulatory Visit: Payer: Self-pay

## 2020-06-12 ENCOUNTER — Ambulatory Visit: Payer: Medicaid Other | Attending: Obstetrics and Gynecology

## 2020-06-12 ENCOUNTER — Other Ambulatory Visit: Payer: Self-pay | Admitting: *Deleted

## 2020-06-12 ENCOUNTER — Ambulatory Visit: Payer: Medicaid Other | Admitting: *Deleted

## 2020-06-12 DIAGNOSIS — O09899 Supervision of other high risk pregnancies, unspecified trimester: Secondary | ICD-10-CM

## 2020-06-12 DIAGNOSIS — Z8632 Personal history of gestational diabetes: Secondary | ICD-10-CM | POA: Insufficient documentation

## 2020-06-12 DIAGNOSIS — O09299 Supervision of pregnancy with other poor reproductive or obstetric history, unspecified trimester: Secondary | ICD-10-CM

## 2020-06-12 DIAGNOSIS — O0990 Supervision of high risk pregnancy, unspecified, unspecified trimester: Secondary | ICD-10-CM | POA: Insufficient documentation

## 2020-06-12 DIAGNOSIS — O99210 Obesity complicating pregnancy, unspecified trimester: Secondary | ICD-10-CM | POA: Diagnosis present

## 2020-06-12 DIAGNOSIS — O283 Abnormal ultrasonic finding on antenatal screening of mother: Secondary | ICD-10-CM

## 2020-07-04 ENCOUNTER — Other Ambulatory Visit: Payer: Self-pay

## 2020-07-04 ENCOUNTER — Ambulatory Visit (INDEPENDENT_AMBULATORY_CARE_PROVIDER_SITE_OTHER): Payer: Medicaid Other | Admitting: Advanced Practice Midwife

## 2020-07-04 VITALS — BP 126/76 | HR 110 | Wt 180.0 lb

## 2020-07-04 DIAGNOSIS — O0990 Supervision of high risk pregnancy, unspecified, unspecified trimester: Secondary | ICD-10-CM

## 2020-07-04 DIAGNOSIS — O359XX0 Maternal care for (suspected) fetal abnormality and damage, unspecified, not applicable or unspecified: Secondary | ICD-10-CM

## 2020-07-04 DIAGNOSIS — Z8632 Personal history of gestational diabetes: Secondary | ICD-10-CM

## 2020-07-04 DIAGNOSIS — O09299 Supervision of pregnancy with other poor reproductive or obstetric history, unspecified trimester: Secondary | ICD-10-CM

## 2020-07-04 NOTE — Progress Notes (Signed)
+   fetal movement. No complaints.  

## 2020-07-04 NOTE — Patient Instructions (Signed)

## 2020-07-05 NOTE — Progress Notes (Signed)
   PRENATAL VISIT NOTE  Subjective:  Michelle Taylor is a 30 y.o. [phone] at [redacted]w[redacted]d being seen today for ongoing prenatal care.  She is currently monitored for the following issues for this high-risk pregnancy and has Prediabetes; Supervision of high risk pregnancy, antepartum; History of preterm delivery, currently pregnant; History of pre-eclampsia in prior pregnancy, currently pregnant; History of gestational diabetes in prior pregnancy, currently pregnant; Maternal obesity affecting pregnancy, antepartum; and [redacted] weeks gestation of pregnancy on their problem list.  Patient reports no complaints.  Contractions: Not present. Vag. Bleeding: None.  Movement: Present. Denies leaking of fluid.   Patient is s/p MFM identification of absent fetal nasal bone. Patient is concerned and has questions regarding timeline for development.   The following portions of the patient's history were reviewed and updated as appropriate: allergies, current medications, past family history, past medical history, past social history, past surgical history and problem list. Problem list updated.  Objective:   Vitals:   07/04/20 1601  BP: 126/76  Pulse: (!) 110  Weight: 180 lb (81.6 kg)    Fetal Status: Fetal Heart Rate (bpm): 152   Movement: Present     General:  Alert, oriented and cooperative. Patient is in no acute distress.  Skin: Skin is warm and dry. No rash noted.   Cardiovascular: Normal heart rate noted  Respiratory: Normal respiratory effort, no problems with respiration noted  Abdomen: Soft, gravid, appropriate for gestational age.  Pain/Pressure: Absent     Pelvic: Cervical exam deferred        Extremities: Normal range of motion.  Edema: None  Mental Status: Normal mood and affect. Normal behavior. Normal judgment and thought content.   Assessment and Plan:  Pregnancy: [id number] at [redacted]w[redacted]d  1. Supervision of high risk pregnancy, antepartum - No acute concerns  2. History of gestational diabetes  in prior pregnancy, currently pregnant - HgbA1C 6.0, normal early GTT  3. History of pre-eclampsia in prior pregnancy, currently pregnant - Compliant with bASA daily - Normotensive  4. Fetal abnormality during pregnancy, antepartum, single or unspecified fetus - MFM report reviewed during visit. Absent fetal nasal bone, pt declined Amnio - Reviewed UTD, MFM surveillance c/w recommendation - Reassurance provided.   Preterm labor symptoms and general obstetric precautions including but not limited to vaginal bleeding, contractions, leaking of fluid and fetal movement were reviewed in detail with the patient. Please refer to After Visit Summary for other counseling recommendations.  Return in about 4 weeks (around 08/01/2020).  Future Appointments  Date Time Provider Department Center  08/01/2020  4:15 PM Warden Fillers, MD [facility] None  08/07/2020  3:30 PM [facility] NURSE [facility]  08/07/2020  3:45 PM [facility] US5 [facility]    Calvert Cantor, CNM

## 2020-08-01 ENCOUNTER — Other Ambulatory Visit: Payer: Self-pay

## 2020-08-01 ENCOUNTER — Ambulatory Visit (INDEPENDENT_AMBULATORY_CARE_PROVIDER_SITE_OTHER): Payer: Medicaid Other | Admitting: Obstetrics and Gynecology

## 2020-08-01 VITALS — BP 114/77 | HR 118 | Wt 183.8 lb

## 2020-08-01 DIAGNOSIS — O09899 Supervision of other high risk pregnancies, unspecified trimester: Secondary | ICD-10-CM

## 2020-08-01 DIAGNOSIS — Z8632 Personal history of gestational diabetes: Secondary | ICD-10-CM

## 2020-08-01 DIAGNOSIS — O0990 Supervision of high risk pregnancy, unspecified, unspecified trimester: Secondary | ICD-10-CM

## 2020-08-01 DIAGNOSIS — O09299 Supervision of pregnancy with other poor reproductive or obstetric history, unspecified trimester: Secondary | ICD-10-CM

## 2020-08-01 DIAGNOSIS — Z3A26 26 weeks gestation of pregnancy: Secondary | ICD-10-CM | POA: Insufficient documentation

## 2020-08-01 NOTE — Progress Notes (Signed)
Patient presents for ROB. Patient has no concerns.  

## 2020-08-01 NOTE — Progress Notes (Signed)
   PRENATAL VISIT NOTE  Subjective:  Michelle Taylor is a 30 y.o. [phone] at [redacted]w[redacted]d being seen today for ongoing prenatal care.  She is currently monitored for the following issues for this high-risk pregnancy and has Prediabetes; Supervision of high risk pregnancy, antepartum; History of preterm delivery, currently pregnant; History of pre-eclampsia in prior pregnancy, currently pregnant; History of gestational diabetes in prior pregnancy, currently pregnant; Maternal obesity affecting pregnancy, antepartum; [redacted] weeks gestation of pregnancy; and [redacted] weeks gestation of pregnancy on their problem list.  Patient doing well with no acute concerns today. She reports no complaints.  Contractions: Not present. Vag. Bleeding: None.  Movement: Present. Denies leaking of fluid.   The following portions of the patient's history were reviewed and updated as appropriate: allergies, current medications, past family history, past medical history, past social history, past surgical history and problem list. Problem list updated.  Objective:   Vitals:   08/01/20 1613  BP: 114/77  Pulse: (!) 118  Weight: 183 lb 12.8 oz (83.4 kg)    Fetal Status: Fetal Heart Rate (bpm): 140 Fundal Height: 27 cm Movement: Present     General:  Alert, oriented and cooperative. Patient is in no acute distress.  Skin: Skin is warm and dry. No rash noted.   Cardiovascular: Normal heart rate noted  Respiratory: Normal respiratory effort, no problems with respiration noted  Abdomen: Soft, gravid, appropriate for gestational age.  Pain/Pressure: Absent     Pelvic: Cervical exam deferred        Extremities: Normal range of motion.  Edema: None  Mental Status:  Normal mood and affect. Normal behavior. Normal judgment and thought content.   Assessment and Plan:  Pregnancy: [id number] at [redacted]w[redacted]d  1. Supervision of high risk pregnancy, antepartum Continue routine care  2. History of preterm delivery, currently pregnant No s/sx  PTL  3. History of pre-eclampsia in prior pregnancy, currently pregnant BP WNL  4. History of gestational diabetes in prior pregnancy, currently pregnant Passed early 2 hour GTT, recheck at 28 weeks  5. [redacted] weeks gestation of pregnancy   Preterm labor symptoms and general obstetric precautions including but not limited to vaginal bleeding, contractions, leaking of fluid and fetal movement were reviewed in detail with the patient.  Please refer to After Visit Summary for other counseling recommendations.   Return in about 2 weeks (around 08/15/2020) for ROB, in person, 3rd trim labs, 2 hr GTT.   Mariel Aloe, MD Faculty Attending Center for [facility]

## 2020-08-07 ENCOUNTER — Other Ambulatory Visit: Payer: Self-pay

## 2020-08-07 ENCOUNTER — Ambulatory Visit: Payer: Medicaid Other | Attending: Obstetrics and Gynecology

## 2020-08-07 ENCOUNTER — Ambulatory Visit: Payer: Medicaid Other | Admitting: *Deleted

## 2020-08-07 ENCOUNTER — Encounter: Payer: Self-pay | Admitting: *Deleted

## 2020-08-07 DIAGNOSIS — O283 Abnormal ultrasonic finding on antenatal screening of mother: Secondary | ICD-10-CM | POA: Insufficient documentation

## 2020-08-07 DIAGNOSIS — O09899 Supervision of other high risk pregnancies, unspecified trimester: Secondary | ICD-10-CM | POA: Insufficient documentation

## 2020-08-07 DIAGNOSIS — O99210 Obesity complicating pregnancy, unspecified trimester: Secondary | ICD-10-CM | POA: Diagnosis present

## 2020-08-07 DIAGNOSIS — Z3A27 27 weeks gestation of pregnancy: Secondary | ICD-10-CM

## 2020-08-07 DIAGNOSIS — O09299 Supervision of pregnancy with other poor reproductive or obstetric history, unspecified trimester: Secondary | ICD-10-CM | POA: Diagnosis present

## 2020-08-07 DIAGNOSIS — O09212 Supervision of pregnancy with history of pre-term labor, second trimester: Secondary | ICD-10-CM

## 2020-08-07 DIAGNOSIS — Z363 Encounter for antenatal screening for malformations: Secondary | ICD-10-CM | POA: Diagnosis not present

## 2020-08-07 DIAGNOSIS — E669 Obesity, unspecified: Secondary | ICD-10-CM

## 2020-08-07 DIAGNOSIS — O99212 Obesity complicating pregnancy, second trimester: Secondary | ICD-10-CM | POA: Diagnosis not present

## 2020-08-07 DIAGNOSIS — Z8632 Personal history of gestational diabetes: Secondary | ICD-10-CM | POA: Diagnosis present

## 2020-08-08 ENCOUNTER — Other Ambulatory Visit: Payer: Self-pay | Admitting: *Deleted

## 2020-08-08 DIAGNOSIS — O09299 Supervision of pregnancy with other poor reproductive or obstetric history, unspecified trimester: Secondary | ICD-10-CM

## 2020-08-15 ENCOUNTER — Other Ambulatory Visit: Payer: Self-pay

## 2020-08-15 ENCOUNTER — Other Ambulatory Visit: Payer: Medicaid Other

## 2020-08-15 ENCOUNTER — Ambulatory Visit (INDEPENDENT_AMBULATORY_CARE_PROVIDER_SITE_OTHER): Payer: Medicaid Other | Admitting: Obstetrics and Gynecology

## 2020-08-15 VITALS — BP 125/74 | HR 104 | Wt 181.0 lb

## 2020-08-15 DIAGNOSIS — Z3A28 28 weeks gestation of pregnancy: Secondary | ICD-10-CM | POA: Diagnosis not present

## 2020-08-15 DIAGNOSIS — O0990 Supervision of high risk pregnancy, unspecified, unspecified trimester: Secondary | ICD-10-CM

## 2020-08-15 DIAGNOSIS — O09299 Supervision of pregnancy with other poor reproductive or obstetric history, unspecified trimester: Secondary | ICD-10-CM

## 2020-08-15 DIAGNOSIS — Z3483 Encounter for supervision of other normal pregnancy, third trimester: Secondary | ICD-10-CM | POA: Diagnosis not present

## 2020-08-15 DIAGNOSIS — O09293 Supervision of pregnancy with other poor reproductive or obstetric history, third trimester: Secondary | ICD-10-CM

## 2020-08-15 DIAGNOSIS — O0993 Supervision of high risk pregnancy, unspecified, third trimester: Secondary | ICD-10-CM | POA: Diagnosis not present

## 2020-08-15 DIAGNOSIS — Z8632 Personal history of gestational diabetes: Secondary | ICD-10-CM

## 2020-08-15 DIAGNOSIS — O09899 Supervision of other high risk pregnancies, unspecified trimester: Secondary | ICD-10-CM

## 2020-08-15 DIAGNOSIS — Z23 Encounter for immunization: Secondary | ICD-10-CM

## 2020-08-15 DIAGNOSIS — O09893 Supervision of other high risk pregnancies, third trimester: Secondary | ICD-10-CM

## 2020-08-15 NOTE — Progress Notes (Addendum)
HROB/GTT.  TDAP given in LD, tolerated well.

## 2020-08-15 NOTE — Progress Notes (Signed)
   PRENATAL VISIT NOTE  Subjective:  Michelle Taylor is a 30 y.o. [phone] at [redacted]w[redacted]d being seen today for ongoing prenatal care.  She is currently monitored for the following issues for this high-risk pregnancy and has Prediabetes; Supervision of high risk pregnancy, antepartum; History of preterm delivery, currently pregnant; History of pre-eclampsia in prior pregnancy, currently pregnant; History of gestational diabetes in prior pregnancy, currently pregnant; Maternal obesity affecting pregnancy, antepartum; [redacted] weeks gestation of pregnancy; [redacted] weeks gestation of pregnancy; and [redacted] weeks gestation of pregnancy on their problem list.  Patient doing well with no acute concerns today. She reports no complaints.  Contractions: Not present. Vag. Bleeding: None.  Movement: Present. Denies leaking of fluid.   The following portions of the patient's history were reviewed and updated as appropriate: allergies, current medications, past family history, past medical history, past social history, past surgical history and problem list. Problem list updated.  Objective:   Vitals:   08/15/20 0835  BP: 125/74  Pulse: (!) 104  Weight: 181 lb (82.1 kg)    Fetal Status: Fetal Heart Rate (bpm): 146 Fundal Height: 28 cm Movement: Present     General:  Alert, oriented and cooperative. Patient is in no acute distress.  Skin: Skin is warm and dry. No rash noted.   Cardiovascular: Normal heart rate noted  Respiratory: Normal respiratory effort, no problems with respiration noted  Abdomen: Soft, gravid, appropriate for gestational age.  Pain/Pressure: Absent     Pelvic: Cervical exam deferred        Extremities: Normal range of motion.  Edema: None  Mental Status:  Normal mood and affect. Normal behavior. Normal judgment and thought content.   Assessment and Plan:  Pregnancy: [id number] at [redacted]w[redacted]d  1. Supervision of high risk pregnancy, antepartum Routine visit - Glucose Tolerance, 2 Hours w/1 Hour - RPR - HIV  antibody (with reflex) - CBC - Tdap vaccine greater than or equal to 7yo IM  2. [redacted] weeks gestation of pregnancy   3. History of preterm delivery, currently pregnant No s/sx of PTL, CL WNL  4. History of pre-eclampsia in prior pregnancy, currently pregnant n s/sx of preeclampsia, normal BP  5. History of gestational diabetes in prior pregnancy, currently pregnant 2 hour GTT today  Preterm labor symptoms and general obstetric precautions including but not limited to vaginal bleeding, contractions, leaking of fluid and fetal movement were reviewed in detail with the patient.  Please refer to After Visit Summary for other counseling recommendations.   Return in about 2 weeks (around 08/29/2020) for [facility], in person.   Mariel Aloe, MD Faculty Attending Center for [facility]

## 2020-08-16 LAB — CBC
Hematocrit: 33 % — ABNORMAL LOW (ref 34.0–46.6)
Hemoglobin: 10.8 g/dL — ABNORMAL LOW (ref 11.1–15.9)
MCH: 30.1 pg (ref 26.6–33.0)
MCHC: 32.7 g/dL (ref 31.5–35.7)
MCV: 92 fL (ref 79–97)
Platelets: 353 10*3/uL (ref 150–450)
RBC: 3.59 x10E6/uL — ABNORMAL LOW (ref 3.77–5.28)
RDW: 12.7 % (ref 11.7–15.4)
WBC: 5.4 10*3/uL (ref 3.4–10.8)

## 2020-08-16 LAB — GLUCOSE TOLERANCE, 2 HOURS W/ 1HR
Glucose, 1 hour: 120 mg/dL (ref 65–179)
Glucose, 2 hour: 115 mg/dL (ref 65–152)
Glucose, Fasting: 81 mg/dL (ref 65–91)

## 2020-08-16 LAB — RPR: RPR Ser Ql: NONREACTIVE

## 2020-08-16 LAB — HIV ANTIBODY (ROUTINE TESTING W REFLEX): HIV Screen 4th Generation wRfx: NONREACTIVE

## 2020-08-29 ENCOUNTER — Ambulatory Visit (INDEPENDENT_AMBULATORY_CARE_PROVIDER_SITE_OTHER): Payer: Medicaid Other | Admitting: Obstetrics & Gynecology

## 2020-08-29 ENCOUNTER — Other Ambulatory Visit: Payer: Self-pay

## 2020-08-29 DIAGNOSIS — O99210 Obesity complicating pregnancy, unspecified trimester: Secondary | ICD-10-CM

## 2020-08-29 DIAGNOSIS — O09899 Supervision of other high risk pregnancies, unspecified trimester: Secondary | ICD-10-CM

## 2020-08-29 DIAGNOSIS — O09299 Supervision of pregnancy with other poor reproductive or obstetric history, unspecified trimester: Secondary | ICD-10-CM

## 2020-08-29 DIAGNOSIS — Z8632 Personal history of gestational diabetes: Secondary | ICD-10-CM

## 2020-08-29 NOTE — Patient Instructions (Signed)

## 2020-08-29 NOTE — Progress Notes (Signed)
   PRENATAL VISIT NOTE  Subjective:  Michelle Taylor is a 30 y.o. [phone] at [redacted]w[redacted]d being seen today for ongoing prenatal care.  She is currently monitored for the following issues for this high-risk pregnancy and has Prediabetes; Supervision of high risk pregnancy, antepartum; History of preterm delivery, currently pregnant; History of pre-eclampsia in prior pregnancy, currently pregnant; History of gestational diabetes in prior pregnancy, currently pregnant; and Maternal obesity affecting pregnancy, antepartum on their problem list.  Patient reports no complaints.  Contractions: Not present. Vag. Bleeding: None.  Movement: Present. Denies leaking of fluid.   The following portions of the patient's history were reviewed and updated as appropriate: allergies, current medications, past family history, past medical history, past social history, past surgical history and problem list.   Objective:   Vitals:   08/29/20 1603  BP: 122/78  Pulse: 94  Weight: 184 lb 14.4 oz (83.9 kg)    Fetal Status: Fetal Heart Rate (bpm): 138   Movement: Present     General:  Alert, oriented and cooperative. Patient is in no acute distress.  Skin: Skin is warm and dry. No rash noted.   Cardiovascular: Normal heart rate noted  Respiratory: Normal respiratory effort, no problems with respiration noted  Abdomen: Soft, gravid, appropriate for gestational age.  Pain/Pressure: Absent     Pelvic: Cervical exam deferred        Extremities: Normal range of motion.  Edema: None  Mental Status: Normal mood and affect. Normal behavior. Normal judgment and thought content.   Assessment and Plan:  Pregnancy: [id number] at [redacted]w[redacted]d 1. History of preterm delivery, currently pregnant [redacted] weeks severe preE 2. History of pre-eclampsia in prior pregnancy, currently pregnant   3. History of gestational diabetes in prior pregnancy, currently pregnant Normal screen  4. Maternal obesity affecting pregnancy, antepartum   Preterm  labor symptoms and general obstetric precautions including but not limited to vaginal bleeding, contractions, leaking of fluid and fetal movement were reviewed in detail with the patient. Please refer to After Visit Summary for other counseling recommendations.   Return in about 2 weeks (around 09/12/2020).  Future Appointments  Date Time Provider Department Center  09/18/2020  3:30 PM [facility] NURSE [facility]  09/18/2020  3:45 PM [facility]5 [facility]    Scheryl Darter, MD

## 2020-09-12 ENCOUNTER — Other Ambulatory Visit: Payer: Self-pay

## 2020-09-12 ENCOUNTER — Ambulatory Visit (INDEPENDENT_AMBULATORY_CARE_PROVIDER_SITE_OTHER): Payer: Medicaid Other | Admitting: Advanced Practice Midwife

## 2020-09-12 VITALS — BP 133/89 | HR 108 | Wt 183.6 lb

## 2020-09-12 DIAGNOSIS — Z8632 Personal history of gestational diabetes: Secondary | ICD-10-CM

## 2020-09-12 DIAGNOSIS — O09899 Supervision of other high risk pregnancies, unspecified trimester: Secondary | ICD-10-CM

## 2020-09-12 DIAGNOSIS — O0990 Supervision of high risk pregnancy, unspecified, unspecified trimester: Secondary | ICD-10-CM

## 2020-09-12 DIAGNOSIS — O09299 Supervision of pregnancy with other poor reproductive or obstetric history, unspecified trimester: Secondary | ICD-10-CM

## 2020-09-12 NOTE — Progress Notes (Signed)
   PRENATAL VISIT NOTE  Subjective:  Michelle Taylor is a 30 y.o. [phone] at 104w3d being seen today for ongoing prenatal care.  She is currently monitored for the following issues for this high-risk pregnancy and has Prediabetes; Supervision of high risk pregnancy, antepartum; History of preterm delivery, currently pregnant; History of pre-eclampsia in prior pregnancy, currently pregnant; History of gestational diabetes in prior pregnancy, currently pregnant; and Maternal obesity affecting pregnancy, antepartum on their problem list.  Patient reports no complaints.  Contractions: Not present. Vag. Bleeding: None.  Movement: Present. Denies leaking of fluid.   The following portions of the patient's history were reviewed and updated as appropriate: allergies, current medications, past family history, past medical history, past social history, past surgical history and problem list.   Objective:   Vitals:   09/12/20 1611  BP: 133/89  Pulse: (!) 108  Weight: 183 lb 9.6 oz (83.3 kg)    Fetal Status: Fetal Heart Rate (bpm): 138   Movement: Present     General:  Alert, oriented and cooperative. Patient is in no acute distress.  Skin: Skin is warm and dry. No rash noted.   Cardiovascular: Normal heart rate noted  Respiratory: Normal respiratory effort, no problems with respiration noted  Abdomen: Soft, gravid, appropriate for gestational age.  Pain/Pressure: Absent     Pelvic: Cervical exam deferred        Extremities: Normal range of motion.  Edema: None  Mental Status: Normal mood and affect. Normal behavior. Normal judgment and thought content.   Assessment and Plan:  Pregnancy: [id number] at 108w3d 1. History of preterm delivery, currently pregnant --Hx preterm delivery at 35 weeks, no s/sx of PTL this pregnancy  2. History of pre-eclampsia in prior pregnancy, currently pregnant --Taking BASA, no HTN, no s/sx of PEC  3. History of gestational diabetes in prior pregnancy, currently  pregnant --Normal GTT this pregnancy  4. Supervision of high risk pregnancy, antepartum --Anticipatory guidance about next visits/weeks of pregnancy given. --Next visit in 2 weeks --Pt has to wait for pharmacy to order her Vitafol Ultra so samples given from office today for Vitafol Ultra x 6 tabs. Pt asked about smaller tablets so samples of Prenate Mini also given for pt to try.  Will ask at next visit if she wants to change prescription.   Preterm labor symptoms and general obstetric precautions including but not limited to vaginal bleeding, contractions, leaking of fluid and fetal movement were reviewed in detail with the patient. Please refer to After Visit Summary for other counseling recommendations.   No follow-ups on file.  Future Appointments  Date Time Provider Department Center  09/18/2020  3:30 PM [facility] NURSE [facility]  09/18/2020  3:45 PM [facility] US5 [facility]    Sharen Counter, CNM

## 2020-09-18 ENCOUNTER — Encounter: Payer: Self-pay | Admitting: *Deleted

## 2020-09-18 ENCOUNTER — Other Ambulatory Visit: Payer: Self-pay

## 2020-09-18 ENCOUNTER — Ambulatory Visit: Payer: Medicaid Other | Admitting: *Deleted

## 2020-09-18 ENCOUNTER — Ambulatory Visit: Payer: Medicaid Other | Attending: Obstetrics

## 2020-09-18 VITALS — BP 123/73 | HR 116

## 2020-09-18 DIAGNOSIS — O09299 Supervision of pregnancy with other poor reproductive or obstetric history, unspecified trimester: Secondary | ICD-10-CM | POA: Diagnosis not present

## 2020-09-18 DIAGNOSIS — O09899 Supervision of other high risk pregnancies, unspecified trimester: Secondary | ICD-10-CM | POA: Diagnosis present

## 2020-09-18 DIAGNOSIS — O99210 Obesity complicating pregnancy, unspecified trimester: Secondary | ICD-10-CM | POA: Insufficient documentation

## 2020-09-18 DIAGNOSIS — Z8632 Personal history of gestational diabetes: Secondary | ICD-10-CM | POA: Diagnosis present

## 2020-09-19 ENCOUNTER — Other Ambulatory Visit: Payer: Self-pay | Admitting: *Deleted

## 2020-09-19 DIAGNOSIS — O3663X0 Maternal care for excessive fetal growth, third trimester, not applicable or unspecified: Secondary | ICD-10-CM

## 2020-09-26 ENCOUNTER — Ambulatory Visit (INDEPENDENT_AMBULATORY_CARE_PROVIDER_SITE_OTHER): Payer: Medicaid Other | Admitting: Obstetrics

## 2020-09-26 ENCOUNTER — Encounter: Payer: Self-pay | Admitting: Obstetrics

## 2020-09-26 ENCOUNTER — Other Ambulatory Visit: Payer: Self-pay

## 2020-09-26 VITALS — BP 114/74 | HR 93 | Wt 190.5 lb

## 2020-09-26 DIAGNOSIS — O09899 Supervision of other high risk pregnancies, unspecified trimester: Secondary | ICD-10-CM

## 2020-09-26 DIAGNOSIS — O09299 Supervision of pregnancy with other poor reproductive or obstetric history, unspecified trimester: Secondary | ICD-10-CM

## 2020-09-26 DIAGNOSIS — Z8632 Personal history of gestational diabetes: Secondary | ICD-10-CM

## 2020-09-26 DIAGNOSIS — O0990 Supervision of high risk pregnancy, unspecified, unspecified trimester: Secondary | ICD-10-CM

## 2020-09-26 DIAGNOSIS — O99210 Obesity complicating pregnancy, unspecified trimester: Secondary | ICD-10-CM

## 2020-09-26 NOTE — Progress Notes (Signed)
Subjective:  Michelle Taylor is a 30 y.o. [phone] at [redacted]w[redacted]d being seen today for ongoing prenatal care.  She is currently monitored for the following issues for this high-risk pregnancy and has Prediabetes; Supervision of high risk pregnancy, antepartum; History of preterm delivery, currently pregnant; History of pre-eclampsia in prior pregnancy, currently pregnant; History of gestational diabetes in prior pregnancy, currently pregnant; and Maternal obesity affecting pregnancy, antepartum on their problem list.  Patient reports heartburn.  Contractions: Not present. Vag. Bleeding: None.  Movement: Present. Denies leaking of fluid.   The following portions of the patient's history were reviewed and updated as appropriate: allergies, current medications, past family history, past medical history, past social history, past surgical history and problem list. Problem list updated.  Objective:   Vitals:   09/26/20 1622  BP: 114/74  Pulse: 93  Weight: 190 lb 8 oz (86.4 kg)    Fetal Status: Fetal Heart Rate (bpm): 145   Movement: Present     General:  Alert, oriented and cooperative. Patient is in no acute distress.  Skin: Skin is warm and dry. No rash noted.   Cardiovascular: Normal heart rate noted  Respiratory: Normal respiratory effort, no problems with respiration noted  Abdomen: Soft, gravid, appropriate for gestational age. Pain/Pressure: Absent     Pelvic:  Cervical exam deferred      FH = 35 cm  Extremities: Normal range of motion.  Edema: None  Mental Status: Normal mood and affect. Normal behavior. Normal judgment and thought content.   Urinalysis:      Assessment and Plan:  Pregnancy: [id number] at [redacted]w[redacted]d  1. Supervision of high risk pregnancy, antepartum  2. History of preterm delivery, currently pregnant  3. History of pre-eclampsia in prior pregnancy, currently pregnant - taking Baby ASA  4. History of gestational diabetes in prior pregnancy, currently pregnant - normal 2  hour GTT  5. Maternal obesity affecting pregnancy, antepartum   Preterm labor symptoms and general obstetric precautions including but not limited to vaginal bleeding, contractions, leaking of fluid and fetal movement were reviewed in detail with the patient. Please refer to After Visit Summary for other counseling recommendations.  Return in about 2 weeks (around 10/10/2020) for ROB.   Brock Bad, MD  09/26/20

## 2020-10-10 ENCOUNTER — Other Ambulatory Visit (HOSPITAL_COMMUNITY)
Admission: RE | Admit: 2020-10-10 | Discharge: 2020-10-10 | Disposition: A | Discharge status: Home / Self Care | Payer: Medicaid Other | Source: Ambulatory Visit | Attending: Obstetrics and Gynecology | Admitting: Obstetrics and Gynecology

## 2020-10-10 ENCOUNTER — Encounter: Payer: Self-pay | Admitting: Obstetrics and Gynecology

## 2020-10-10 ENCOUNTER — Ambulatory Visit (INDEPENDENT_AMBULATORY_CARE_PROVIDER_SITE_OTHER): Payer: Medicaid Other | Admitting: Obstetrics and Gynecology

## 2020-10-10 ENCOUNTER — Other Ambulatory Visit: Payer: Self-pay

## 2020-10-10 VITALS — BP 123/82 | HR 116 | Wt 188.5 lb

## 2020-10-10 DIAGNOSIS — O0990 Supervision of high risk pregnancy, unspecified, unspecified trimester: Secondary | ICD-10-CM | POA: Insufficient documentation

## 2020-10-10 DIAGNOSIS — O99210 Obesity complicating pregnancy, unspecified trimester: Secondary | ICD-10-CM

## 2020-10-10 DIAGNOSIS — O09299 Supervision of pregnancy with other poor reproductive or obstetric history, unspecified trimester: Secondary | ICD-10-CM

## 2020-10-10 NOTE — Progress Notes (Signed)
ROB 36.3wks GBS, GC/CC today No complaints

## 2020-10-10 NOTE — Progress Notes (Signed)
   PRENATAL VISIT NOTE  Subjective:  Michelle Taylor is a 30 y.o. [phone] at [redacted]w[redacted]d being seen today for ongoing prenatal care.  She is currently monitored for the following issues for this high-risk pregnancy and has Prediabetes; Supervision of high risk pregnancy, antepartum; History of preterm delivery, currently pregnant; History of pre-eclampsia in prior pregnancy, currently pregnant; History of gestational diabetes in prior pregnancy, currently pregnant; and Maternal obesity affecting pregnancy, antepartum on their problem list.  Patient reports no complaints.  Contractions: Not present. Vag. Bleeding: None.  Movement: Present. Denies leaking of fluid.   The following portions of the patient's history were reviewed and updated as appropriate: allergies, current medications, past family history, past medical history, past social history, past surgical history and problem list.   Objective:   Vitals:   10/10/20 1536  BP: 123/82  Pulse: (!) 116  Weight: 188 lb 8 oz (85.5 kg)    Fetal Status: Fetal Heart Rate (bpm): 150 Fundal Height: 36 cm Movement: Present  Presentation: Vertex  General:  Alert, oriented and cooperative. Patient is in no acute distress.  Skin: Skin is warm and dry. No rash noted.   Cardiovascular: Normal heart rate noted  Respiratory: Normal respiratory effort, no problems with respiration noted  Abdomen: Soft, gravid, appropriate for gestational age.  Pain/Pressure: Absent     Pelvic: Cervical exam performed in the presence of a chaperone Dilation: 1 Effacement (%): Thick Station: Ballotable  Extremities: Normal range of motion.     Mental Status: Normal mood and affect. Normal behavior. Normal judgment and thought content.   Assessment and Plan:  Pregnancy: [id number] at [redacted]w[redacted]d 1. Supervision of high risk pregnancy, antepartum Patient is doing well without complaints Cultures today  2. Maternal obesity affecting pregnancy, antepartum Continue ASA Follow up  growth ultrasound on 7/18  3. History of pre-eclampsia in prior pregnancy, currently pregnant Normotensive and asymptomatic  Preterm labor symptoms and general obstetric precautions including but not limited to vaginal bleeding, contractions, leaking of fluid and fetal movement were reviewed in detail with the patient. Please refer to After Visit Summary for other counseling recommendations.   Return in about 1 week (around 10/17/2020) for in person, ROB, High risk.  Future Appointments  Date Time Provider Department Center  10/16/2020  2:30 PM [facility] NURSE [facility]  10/16/2020  2:45 PM [facility] US4 [facility]    Catalina Antigua, MD

## 2020-10-11 LAB — CERVICOVAGINAL ANCILLARY ONLY
Chlamydia: NEGATIVE
Comment: NEGATIVE
Comment: NORMAL
Neisseria Gonorrhea: NEGATIVE

## 2020-10-12 ENCOUNTER — Encounter: Payer: Self-pay | Admitting: Obstetrics and Gynecology

## 2020-10-12 DIAGNOSIS — O99820 Streptococcus B carrier state complicating pregnancy: Secondary | ICD-10-CM | POA: Insufficient documentation

## 2020-10-12 LAB — STREP GP B NAA: Strep Gp B NAA: POSITIVE — AB

## 2020-10-16 ENCOUNTER — Encounter: Payer: Self-pay | Admitting: *Deleted

## 2020-10-16 ENCOUNTER — Ambulatory Visit: Payer: Medicaid Other | Attending: Obstetrics and Gynecology

## 2020-10-16 ENCOUNTER — Ambulatory Visit: Payer: Medicaid Other | Admitting: *Deleted

## 2020-10-16 ENCOUNTER — Other Ambulatory Visit: Payer: Self-pay

## 2020-10-16 VITALS — BP 122/82 | HR 94

## 2020-10-16 DIAGNOSIS — O99820 Streptococcus B carrier state complicating pregnancy: Secondary | ICD-10-CM | POA: Diagnosis present

## 2020-10-16 DIAGNOSIS — O09299 Supervision of pregnancy with other poor reproductive or obstetric history, unspecified trimester: Secondary | ICD-10-CM | POA: Insufficient documentation

## 2020-10-16 DIAGNOSIS — Z362 Encounter for other antenatal screening follow-up: Secondary | ICD-10-CM | POA: Diagnosis not present

## 2020-10-16 DIAGNOSIS — O09899 Supervision of other high risk pregnancies, unspecified trimester: Secondary | ICD-10-CM | POA: Insufficient documentation

## 2020-10-16 DIAGNOSIS — Z8632 Personal history of gestational diabetes: Secondary | ICD-10-CM

## 2020-10-16 DIAGNOSIS — E669 Obesity, unspecified: Secondary | ICD-10-CM

## 2020-10-16 DIAGNOSIS — O359XX0 Maternal care for (suspected) fetal abnormality and damage, unspecified, not applicable or unspecified: Secondary | ICD-10-CM | POA: Diagnosis not present

## 2020-10-16 DIAGNOSIS — O99210 Obesity complicating pregnancy, unspecified trimester: Secondary | ICD-10-CM

## 2020-10-16 DIAGNOSIS — O99213 Obesity complicating pregnancy, third trimester: Secondary | ICD-10-CM | POA: Diagnosis not present

## 2020-10-16 DIAGNOSIS — O09213 Supervision of pregnancy with history of pre-term labor, third trimester: Secondary | ICD-10-CM

## 2020-10-16 DIAGNOSIS — O3663X0 Maternal care for excessive fetal growth, third trimester, not applicable or unspecified: Secondary | ICD-10-CM | POA: Diagnosis present

## 2020-10-16 DIAGNOSIS — Z3A37 37 weeks gestation of pregnancy: Secondary | ICD-10-CM

## 2020-10-17 ENCOUNTER — Ambulatory Visit (INDEPENDENT_AMBULATORY_CARE_PROVIDER_SITE_OTHER): Payer: Medicaid Other | Admitting: Obstetrics and Gynecology

## 2020-10-17 VITALS — BP 118/79 | HR 103 | Wt 190.0 lb

## 2020-10-17 DIAGNOSIS — O99820 Streptococcus B carrier state complicating pregnancy: Secondary | ICD-10-CM

## 2020-10-17 DIAGNOSIS — O3663X1 Maternal care for excessive fetal growth, third trimester, fetus 1: Secondary | ICD-10-CM | POA: Insufficient documentation

## 2020-10-17 DIAGNOSIS — O09299 Supervision of pregnancy with other poor reproductive or obstetric history, unspecified trimester: Secondary | ICD-10-CM

## 2020-10-17 DIAGNOSIS — O09899 Supervision of other high risk pregnancies, unspecified trimester: Secondary | ICD-10-CM

## 2020-10-17 DIAGNOSIS — O0990 Supervision of high risk pregnancy, unspecified, unspecified trimester: Secondary | ICD-10-CM

## 2020-10-17 DIAGNOSIS — Z8632 Personal history of gestational diabetes: Secondary | ICD-10-CM

## 2020-10-17 DIAGNOSIS — Z3A37 37 weeks gestation of pregnancy: Secondary | ICD-10-CM | POA: Insufficient documentation

## 2020-10-17 NOTE — Progress Notes (Signed)
   PRENATAL VISIT NOTE  Subjective:  Michelle Taylor is a 30 y.o. [phone] at [redacted]w[redacted]d being seen today for ongoing prenatal care.  She is currently monitored for the following issues for this low-risk pregnancy and has Prediabetes; Supervision of high risk pregnancy, antepartum; History of preterm delivery, currently pregnant; History of pre-eclampsia in prior pregnancy, currently pregnant; History of gestational diabetes in prior pregnancy, currently pregnant; Maternal obesity affecting pregnancy, antepartum; GBS (group B Streptococcus carrier), +RV culture, currently pregnant; [redacted] weeks gestation of pregnancy; and LGA (large for gestational age) fetus affecting management of mother, third trimester, fetus 1 on their problem list.  Patient doing well with no acute concerns today. She reports occasional contractions.  Contractions: Not present. Vag. Bleeding: None.  Movement: Present. Denies leaking of fluid.   The following portions of the patient's history were reviewed and updated as appropriate: allergies, current medications, past family history, past medical history, past social history, past surgical history and problem list. Problem list updated.  Objective:   Vitals:   10/17/20 1428  BP: 118/79  Pulse: (!) 103  Weight: 190 lb (86.2 kg)    Fetal Status: Fetal Heart Rate (bpm): 150 Fundal Height: 39 cm Movement: Present     General:  Alert, oriented and cooperative. Patient is in no acute distress.  Skin: Skin is warm and dry. No rash noted.   Cardiovascular: Normal heart rate noted  Respiratory: Normal respiratory effort, no problems with respiration noted  Abdomen: Soft, gravid, appropriate for gestational age.  Pain/Pressure: Present     Pelvic: Cervical exam performed Dilation: 1 Effacement (%): Thick Station: Ballotable  Extremities: Normal range of motion.     Mental Status:  Normal mood and affect. Normal behavior. Normal judgment and thought content.   Assessment and Plan:   Pregnancy: G3P0111 at [redacted]w[redacted]d  1. [redacted] weeks gestation of pregnancy   2. Supervision of high risk pregnancy, antepartum Continue routine prenatal care  3. History of preterm delivery, currently pregnant No s/sx of labor  4. History of pre-eclampsia in prior pregnancy, currently pregnant Blood pressure WNL  5. History of gestational diabetes in prior pregnancy, currently pregnant   6. GBS (group B Streptococcus carrier), +RV culture, currently pregnant Treat in labor  7. LGA (large for gestational age) fetus affecting management of mother, third trimester, fetus 1 Recent EFW 92%, vtx  Term labor symptoms and general obstetric precautions including but not limited to vaginal bleeding, contractions, leaking of fluid and fetal movement were reviewed in detail with the patient.  Please refer to After Visit Summary for other counseling recommendations.   Return in about 1 week (around 10/24/2020) for [facility], in person.   Mariel Aloe, MD Faculty Attending Center for [facility]

## 2020-10-21 ENCOUNTER — Inpatient Hospital Stay (HOSPITAL_COMMUNITY): Payer: Medicaid Other | Admitting: Anesthesiology

## 2020-10-21 ENCOUNTER — Encounter (HOSPITAL_COMMUNITY): Payer: Self-pay | Admitting: Family Medicine

## 2020-10-21 ENCOUNTER — Inpatient Hospital Stay (HOSPITAL_COMMUNITY)
Admission: AD | Admit: 2020-10-21 | Discharge: 2020-10-24 | DRG: 805 | Disposition: A | Discharge status: Home / Self Care | Payer: Medicaid Other | Attending: Obstetrics & Gynecology | Admitting: Obstetrics & Gynecology

## 2020-10-21 DIAGNOSIS — R7303 Prediabetes: Secondary | ICD-10-CM | POA: Diagnosis present

## 2020-10-21 DIAGNOSIS — Z3A38 38 weeks gestation of pregnancy: Secondary | ICD-10-CM

## 2020-10-21 DIAGNOSIS — R000 Tachycardia, unspecified: Secondary | ICD-10-CM | POA: Diagnosis present

## 2020-10-21 DIAGNOSIS — O99214 Obesity complicating childbirth: Secondary | ICD-10-CM | POA: Diagnosis present

## 2020-10-21 DIAGNOSIS — O99824 Streptococcus B carrier state complicating childbirth: Secondary | ICD-10-CM | POA: Diagnosis present

## 2020-10-21 DIAGNOSIS — O411230 Chorioamnionitis, third trimester, not applicable or unspecified: Secondary | ICD-10-CM | POA: Diagnosis present

## 2020-10-21 DIAGNOSIS — O99820 Streptococcus B carrier state complicating pregnancy: Secondary | ICD-10-CM

## 2020-10-21 DIAGNOSIS — O09899 Supervision of other high risk pregnancies, unspecified trimester: Secondary | ICD-10-CM

## 2020-10-21 DIAGNOSIS — O09299 Supervision of pregnancy with other poor reproductive or obstetric history, unspecified trimester: Secondary | ICD-10-CM

## 2020-10-21 DIAGNOSIS — O99210 Obesity complicating pregnancy, unspecified trimester: Secondary | ICD-10-CM | POA: Diagnosis present

## 2020-10-21 DIAGNOSIS — Z87891 Personal history of nicotine dependence: Secondary | ICD-10-CM | POA: Diagnosis not present

## 2020-10-21 DIAGNOSIS — O3663X0 Maternal care for excessive fetal growth, third trimester, not applicable or unspecified: Principal | ICD-10-CM | POA: Diagnosis present

## 2020-10-21 DIAGNOSIS — Z20822 Contact with and (suspected) exposure to covid-19: Secondary | ICD-10-CM | POA: Diagnosis present

## 2020-10-21 DIAGNOSIS — O26893 Other specified pregnancy related conditions, third trimester: Secondary | ICD-10-CM | POA: Diagnosis present

## 2020-10-21 DIAGNOSIS — O99893 Other specified diseases and conditions complicating puerperium: Secondary | ICD-10-CM | POA: Diagnosis present

## 2020-10-21 DIAGNOSIS — O411290 Chorioamnionitis, unspecified trimester, not applicable or unspecified: Secondary | ICD-10-CM

## 2020-10-21 DIAGNOSIS — O3663X1 Maternal care for excessive fetal growth, third trimester, fetus 1: Secondary | ICD-10-CM | POA: Diagnosis present

## 2020-10-21 DIAGNOSIS — O0990 Supervision of high risk pregnancy, unspecified, unspecified trimester: Secondary | ICD-10-CM

## 2020-10-21 DIAGNOSIS — Z8632 Personal history of gestational diabetes: Secondary | ICD-10-CM

## 2020-10-21 DIAGNOSIS — O4202 Full-term premature rupture of membranes, onset of labor within 24 hours of rupture: Secondary | ICD-10-CM | POA: Diagnosis not present

## 2020-10-21 LAB — CBC
HCT: 39 % (ref 36.0–46.0)
Hemoglobin: 12.6 g/dL (ref 12.0–15.0)
MCH: 28.8 pg (ref 26.0–34.0)
MCHC: 32.3 g/dL (ref 30.0–36.0)
MCV: 89 fL (ref 80.0–100.0)
Platelets: 361 10*3/uL (ref 150–400)
RBC: 4.38 MIL/uL (ref 3.87–5.11)
RDW: 14 % (ref 11.5–15.5)
WBC: 7.7 10*3/uL (ref 4.0–10.5)
nRBC: 0 % (ref 0.0–0.2)

## 2020-10-21 LAB — RESP PANEL BY RT-PCR (FLU A&B, COVID) ARPGX2
Influenza A by PCR: NEGATIVE
Influenza B by PCR: NEGATIVE
SARS Coronavirus 2 by RT PCR: NEGATIVE

## 2020-10-21 LAB — TYPE AND SCREEN
ABO/RH(D): O POS
Antibody Screen: NEGATIVE

## 2020-10-21 MED ORDER — LIDOCAINE HCL (PF) 1 % IJ SOLN
INTRAMUSCULAR | Status: DC | PRN
  Administered 2020-10-21: 8 mL via EPIDURAL

## 2020-10-21 MED ORDER — OXYTOCIN BOLUS FROM INFUSION
333.0000 mL | Freq: Once | INTRAVENOUS | Status: AC
  Administered 2020-10-22: 333 mL via INTRAVENOUS

## 2020-10-21 MED ORDER — GENTAMICIN SULFATE 40 MG/ML IJ SOLN: 2.0000 mg/kg | INTRAVENOUS | Status: DC

## 2020-10-21 MED ORDER — DEXTROSE 5 % IV SOLN
5.0000 mg/kg | INTRAVENOUS | Status: AC
  Administered 2020-10-22 – 2020-10-23 (×2): 310 mg via INTRAVENOUS
  Filled 2020-10-21 (×2): qty 7.75

## 2020-10-21 MED ORDER — LACTATED RINGERS IV SOLN: INTRAVENOUS | Status: DC

## 2020-10-21 MED ORDER — SODIUM CHLORIDE 0.9 % IV SOLN
1.0000 g | INTRAVENOUS | Status: DC
  Administered 2020-10-21 – 2020-10-22 (×2): 1 g via INTRAVENOUS
  Filled 2020-10-21 (×2): qty 1000

## 2020-10-21 MED ORDER — LACTATED RINGERS IV SOLN
500.0000 mL | INTRAVENOUS | Status: DC | PRN
Start: 2020-10-21 — End: 2020-10-22
  Administered 2020-10-21: 1000 mL via INTRAVENOUS

## 2020-10-21 MED ORDER — LACTATED RINGERS IV SOLN: 500.0000 mL | Freq: Once | INTRAVENOUS | Status: DC

## 2020-10-21 MED ORDER — ACETAMINOPHEN 325 MG PO TABS
650.0000 mg | ORAL_TABLET | ORAL | Status: DC | PRN
  Administered 2020-10-21: 650 mg via ORAL
  Filled 2020-10-21: qty 2

## 2020-10-21 MED ORDER — FENTANYL-BUPIVACAINE-NACL 0.5-0.125-0.9 MG/250ML-% EP SOLN
12.0000 mL/h | EPIDURAL | Status: DC | PRN
  Administered 2020-10-21: 12 mL/h via EPIDURAL
  Filled 2020-10-21: qty 250

## 2020-10-21 MED ORDER — ONDANSETRON HCL 4 MG/2ML IJ SOLN
4.0000 mg | Freq: Four times a day (QID) | INTRAMUSCULAR | Status: DC | PRN
Start: 2020-10-21 — End: 2020-10-22

## 2020-10-21 MED ORDER — OXYTOCIN-SODIUM CHLORIDE 30-0.9 UT/500ML-% IV SOLN
2.5000 [IU]/h | INTRAVENOUS | Status: DC
  Filled 2020-10-21: qty 500

## 2020-10-21 MED ORDER — PHENYLEPHRINE 40 MCG/ML (10ML) SYRINGE FOR IV PUSH (FOR BLOOD PRESSURE SUPPORT): 80.0000 ug | PREFILLED_SYRINGE | INTRAVENOUS | Status: DC | PRN

## 2020-10-21 MED ORDER — SOD CITRATE-CITRIC ACID 500-334 MG/5ML PO SOLN: 30.0000 mL | ORAL | Status: DC | PRN

## 2020-10-21 MED ORDER — DIPHENHYDRAMINE HCL 50 MG/ML IJ SOLN: 12.5000 mg | INTRAMUSCULAR | Status: DC | PRN

## 2020-10-21 MED ORDER — PHENYLEPHRINE 40 MCG/ML (10ML) SYRINGE FOR IV PUSH (FOR BLOOD PRESSURE SUPPORT)
80.0000 ug | PREFILLED_SYRINGE | INTRAVENOUS | Status: DC | PRN
  Filled 2020-10-21: qty 10

## 2020-10-21 MED ORDER — DIPHENHYDRAMINE HCL 50 MG/ML IJ SOLN
12.5000 mg | Freq: Once | INTRAMUSCULAR | Status: AC
  Administered 2020-10-21: 12.5 mg via INTRAVENOUS
  Filled 2020-10-21: qty 1

## 2020-10-21 MED ORDER — OXYCODONE-ACETAMINOPHEN 5-325 MG PO TABS
2.0000 | ORAL_TABLET | ORAL | Status: DC | PRN
Start: 2020-10-21 — End: 2020-10-22

## 2020-10-21 MED ORDER — EPHEDRINE 5 MG/ML INJ: 10.0000 mg | INTRAVENOUS | Status: DC | PRN

## 2020-10-21 MED ORDER — OXYCODONE-ACETAMINOPHEN 5-325 MG PO TABS: 1.0000 | ORAL_TABLET | ORAL | Status: DC | PRN

## 2020-10-21 MED ORDER — SODIUM CHLORIDE 0.9 % IV SOLN
2.0000 g | Freq: Once | INTRAVENOUS | Status: AC
  Administered 2020-10-21: 2 g via INTRAVENOUS
  Filled 2020-10-21: qty 2000

## 2020-10-21 MED ORDER — LIDOCAINE HCL (PF) 1 % IJ SOLN: 30.0000 mL | INTRAMUSCULAR | Status: DC | PRN

## 2020-10-21 NOTE — MAU Note (Signed)
Pt c/o ctxon and off all day. Denies any vag bleeding or leaking  good fetal movement. Brough back to room SVE 7/90/0.

## 2020-10-21 NOTE — Progress Notes (Signed)
CNM at bedside due to FHR deceleration. Patient reports SROM with clear fluid. FHR in 80s. Cervical exam 7/80/0. Dr. Alysia Penna made aware of FHR tracing. Bonna Gains CNM made aware and at bedside.   Rolm Bookbinder, CNM 10/21/20 4:58 PM

## 2020-10-21 NOTE — Anesthesia Procedure Notes (Signed)
Epidural Patient location during procedure: OB Start time: 10/21/2020 5:50 PM End time: 10/21/2020 6:00 PM  Staffing Anesthesiologist: Mellody Dance, MD Performed: anesthesiologist   Preanesthetic Checklist Completed: patient identified, IV checked, site marked, risks and benefits discussed, monitors and equipment checked, pre-op evaluation and timeout performed  Epidural Patient position: sitting Prep: DuraPrep Patient monitoring: heart rate, cardiac monitor, continuous pulse ox and blood pressure Approach: midline Location: L2-L3 Injection technique: LOR saline  Needle:  Needle type: Tuohy  Needle gauge: 17 G Needle length: 9 cm Needle insertion depth: 7.5 cm Catheter type: closed end flexible Catheter size: 20 Guage Catheter at skin depth: 12.5 cm Test dose: negative and Other  Assessment Events: blood not aspirated, injection not painful, no injection resistance and negative IV test  Additional Notes Informed consent obtained prior to proceeding including risk of failure, 1% risk of PDPH, risk of minor discomfort and bruising.  Discussed rare but serious complications including epidural abscess, permanent nerve injury, epidural hematoma.  Discussed alternatives to epidural analgesia and patient desires to proceed.  Timeout performed pre-procedure verifying patient name, procedure, and platelet count.  Patient tolerated procedure well.

## 2020-10-21 NOTE — Progress Notes (Signed)
CNM called to bedside to confirm presentation. Pt informed that the ultrasound is considered a limited OB ultrasound and is not intended to be a complete ultrasound exam.  Patient also informed that the ultrasound is not being completed with the intent of assessing for fetal or placental anomalies or any pelvic abnormalities.  Explained that the purpose of today's ultrasound is to assess for  presentation.  Patient acknowledges the purpose of the exam and the limitations of the study.     Vertex presentation confirmed.  Rolm Bookbinder, CNM 10/21/20 3:34 PM

## 2020-10-21 NOTE — Progress Notes (Signed)
Patient ID: Michelle Taylor, female   DOB: November 27, 1990, 30 y.o.   MRN: [id number]  Transferred from MAU up to L&D room 202 with patient and MAU RN.  IV access by RN prior to transfer and CBC sent, additional labs to be drawn by lab.  Pt desires epidural. FHR tracing Category 2 with variables, moderate variability at this time.  Ampicillin IV started for GBS positive.  Anticipate NSVD.

## 2020-10-21 NOTE — H&P (Signed)
OBSTETRIC ADMISSION HISTORY AND PHYSICAL  Michelle Taylor is a 30 y.o. female [phone] with IUP at 69w0dby LMP presenting for SOL. Starting having contractions this morning. She reports +FMs, No LOF, no VB, no blurry vision, headaches or peripheral edema, and RUQ pain.  She plans on breastfeeding. She is undecided for birth control.  She received her prenatal care at  [facility] --->  Estimated Date of Delivery: 11/04/20  Sono:    '@[redacted]w[redacted]d' , CWD, normal anatomy, cephalic presentation,  36440H 92% EFW   Prenatal History/Complications:   --?Absent nasal bone: however unclear in a few prenatal U/S  --LGA: 92% percentile at 37 weeks   Past Medical History: Past Medical History:  Diagnosis Date   Chlamydia 2008   Gestational diabetes    Pregnancy induced hypertension    Preterm labor    Vaginal Pap smear, abnormal    Yeast infection     Past Surgical History: Past Surgical History:  Procedure Laterality Date   PILONIDAL CYST EXCISION     WISDOM TOOTH EXTRACTION      Obstetrical History: OB History     Gravida  3   Para  1   Term      Preterm  1   AB  1   Living  1      SAB      IAB      Ectopic      Multiple  0   Live Births  1           Social History Social History   Socioeconomic History   Marital status: Single    Spouse name: Not on file   Number of children: 1   Years of education: Not on file   Highest education level: Not on file  Occupational History   Occupation: [employer]  Tobacco Use   Smoking status: Former    Packs/day: 0.25    Types: Cigarettes   Smokeless tobacco: Never  Vaping Use   Vaping Use: Never used  Substance and Sexual Activity   Alcohol use: Not Currently   Drug use: No   Sexual activity: Yes    Partners: Male    Birth control/protection: None  Other Topics Concern   Not on file  Social History Narrative   Not on file   Social Determinants of Health   Financial Resource Strain: Not on file   Food Insecurity: Not on file  Transportation Needs: Not on file  Physical Activity: Not on file  Stress: Not on file  Social Connections: Not on file    Family History: Family History  Problem Relation Age of Onset   Hypertension Paternal Grandmother    Hypertension Paternal Grandfather    Asthma Mother    Asthma Brother     Allergies: No Known Allergies  Medications Prior to Admission  Medication Sig Dispense Refill Last Dose   acetaminophen (TYLENOL) 500 MG tablet Take 1,000 mg by mouth every 6 (six) hours as needed for mild pain or headache. (Patient not taking: Reported on 09/26/2020)      aspirin EC 81 MG tablet Take 1 tablet (81 mg total) by mouth daily. Take after 12 weeks for prevention of preeclampsia later in pregnancy 300 tablet 2    Blood Pressure Monitoring (BLOOD PRESSURE KIT) DEVI 1 kit by Does not apply route once a week. Check Blood Pressure regularly and record readings into the Babyscripts App.  Large Cuff.  DX O90.0 1 each 0  Misc. Devices (GOJJI WEIGHT SCALE) MISC 1 Device by Does not apply route daily as needed. (Patient not taking: Reported on 09/26/2020) 1 each 0    ondansetron (ZOFRAN ODT) 4 MG disintegrating tablet Take 1 tablet (4 mg total) by mouth every 8 (eight) hours as needed for nausea or vomiting. (Patient not taking: No sig reported) 30 tablet 1    Prenat-Fe Poly-Methfol-FA-DHA (VITAFOL ULTRA) 29-0.6-0.4-200 MG CAPS Take 1 capsule by mouth daily. 30 capsule 11      Review of Systems   All systems reviewed and negative except as stated in HPI  Last menstrual period 01/20/2020, unknown if currently breastfeeding. General appearance: alert and cooperative Lungs: Normal WOB  Heart: regular rate and rhythm Abdomen: soft Presentation: cephalic Fetal monitoringBaseline: 140 bpm, Variability: Good {> 6 bpm), Accelerations: Reactive, and Decelerations: Early Uterine activityFrequency: Every 3 minutes Dilation: 7 Effacement (%): 80 Station:  -3 Exam by:: Fredda Hammed RN   Prenatal labs: ABO, Rh: O/Positive/-- (01/11 1140) Antibody: Negative (01/11 1140) Rubella: 23.60 (01/11 1140) RPR: Non Reactive (05/17 0948)  HBsAg: Negative (01/11 1140)  HIV: Non Reactive (05/17 0948)  GBS: Positive/-- (07/12 0405)  2 hr Glucola normal Genetic screening  low risk NIPS Anatomy US normal with absent nasal bone   Prenatal Transfer Tool  Maternal Diabetes: No Genetic Screening: Normal Maternal Ultrasounds/Referrals: Absent nasal bone Fetal Ultrasounds or other Referrals:  None Maternal Substance Abuse:  No Significant Maternal Medications:  None Significant Maternal Lab Results: Group B Strep positive  No results found for this or any previous visit (from the past 24 hour(s)).  Patient Active Problem List   Diagnosis Date Noted   [redacted] weeks gestation of pregnancy 10/17/2020   LGA (large for gestational age) fetus affecting management of mother, third trimester, fetus 1 10/17/2020   GBS (group B Streptococcus carrier), +RV culture, currently pregnant 10/12/2020   History of preterm delivery, currently pregnant 04/11/2020   History of pre-eclampsia in prior pregnancy, currently pregnant 04/11/2020   History of gestational diabetes in prior pregnancy, currently pregnant 04/11/2020   Maternal obesity affecting pregnancy, antepartum 04/11/2020   Supervision of high risk pregnancy, antepartum 11/03/2019   Prediabetes 11/17/2017    Assessment/Plan:  Michelle Taylor is a 30 y.o. G3P0111 at 77w0dhere for SOL.   #Labor:expectant management  #Pain: Prefers epidural #FWB: Cat 1  #ID:  GBS positive, Amp ordered #MOF: breastfeeding #MOC: Undecided, considering nexplanon  #Circ:  Will need to follow up   #LGA: 92% percentile at 37 weeks, will monitor closely.   SPatriciaann Clan DO  10/21/2020, 3:37 PM

## 2020-10-21 NOTE — Progress Notes (Addendum)
   Genette Huertas is a 30 y.o. [phone] at [redacted]w[redacted]d  admitted for active labor  Subjective: Patient doing well, feels some urge to push but it is not constant  Objective: Vitals:   10/21/20 2225 10/21/20 2230 10/21/20 2300 10/21/20 2305  BP:  117/70 128/78   Pulse:  (!) 119 (!) 133   Resp: 18     Temp: 99.5 F (37.5 C)   (!) 100.5 F (38.1 C)  TempSrc:    Axillary  SpO2:      Weight:      Height:       No intake/output data recorded.  FHT:  FHR: 145 bpm, variability: moderate,  accelerations:  Present,  decelerations:  Absent UC:   irregular, every 2-4 minutes SVE:   swollen left on maternal left Pitocin @ 0 mu/min  Labs: Lab Results  Component Value Date   WBC 7.7 10/21/2020   HGB 12.6 10/21/2020   HCT 39.0 10/21/2020   MCV 89.0 10/21/2020   PLT 361 10/21/2020    Assessment / Plan: Spontaneous labor, progressing normally -patient not feeling strong urges to push; is able to push past cervix but between contractions head does not descend past and the lip returns -will give benadryl and reposition  Labor: Progressing normally Fetal Wellbeing:  Category I Pain Control:  Epidural Anticipated MOD:  NSVD  Charlesetta Garibaldi Tallyn Holroyd 10/21/2020, 11:31 PM

## 2020-10-21 NOTE — Anesthesia Preprocedure Evaluation (Addendum)
Anesthesia Evaluation  Patient identified by MRN, date of birth, ID band  Reviewed: Allergy & Precautions, NPO status , Patient's Chart, lab work & pertinent test results  Airway Mallampati: III  TM Distance: >3 FB Neck ROM: Full    Dental no notable dental hx.    Pulmonary former smoker,    Pulmonary exam normal breath sounds clear to auscultation       Cardiovascular Exercise Tolerance: Good hypertension, Normal cardiovascular exam Rhythm:Regular Rate:Normal     Neuro/Psych negative neurological ROS  negative psych ROS   GI/Hepatic negative GI ROS, Neg liver ROS,   Endo/Other  diabetes, Gestational  Renal/GU negative Renal ROS  negative genitourinary   Musculoskeletal negative musculoskeletal ROS (+)   Abdominal   Peds  Hematology negative hematology ROS (+)   Anesthesia Other Findings   Reproductive/Obstetrics negative OB ROS                            Anesthesia Physical Anesthesia Plan  ASA: 3  Anesthesia Plan: Epidural   Post-op Pain Management:    Induction:   PONV Risk Score and Plan: 2  Airway Management Planned: Natural Airway  Additional Equipment: None  Intra-op Plan:   Post-operative Plan: Extubation in OR  Informed Consent: I have reviewed the patients History and Physical, chart, labs and discussed the procedure including the risks, benefits and alternatives for the proposed anesthesia with the patient or authorized representative who has indicated his/her understanding and acceptance.     Dental advisory given  Plan Discussed with: CRNA and Anesthesiologist  Anesthesia Plan Comments:         Anesthesia Quick Evaluation

## 2020-10-22 ENCOUNTER — Encounter (HOSPITAL_COMMUNITY): Payer: Self-pay | Admitting: *Deleted

## 2020-10-22 DIAGNOSIS — Z3A38 38 weeks gestation of pregnancy: Secondary | ICD-10-CM

## 2020-10-22 DIAGNOSIS — O411290 Chorioamnionitis, unspecified trimester, not applicable or unspecified: Secondary | ICD-10-CM

## 2020-10-22 DIAGNOSIS — O3663X0 Maternal care for excessive fetal growth, third trimester, not applicable or unspecified: Secondary | ICD-10-CM

## 2020-10-22 DIAGNOSIS — O99820 Streptococcus B carrier state complicating pregnancy: Secondary | ICD-10-CM

## 2020-10-22 DIAGNOSIS — O4202 Full-term premature rupture of membranes, onset of labor within 24 hours of rupture: Secondary | ICD-10-CM

## 2020-10-22 LAB — RPR: RPR Ser Ql: NONREACTIVE

## 2020-10-22 MED ORDER — ONDANSETRON HCL 4 MG PO TABS: 4.0000 mg | ORAL_TABLET | ORAL | Status: DC | PRN

## 2020-10-22 MED ORDER — DIPHENHYDRAMINE HCL 25 MG PO CAPS: 25.0000 mg | ORAL_CAPSULE | Freq: Four times a day (QID) | ORAL | Status: DC | PRN

## 2020-10-22 MED ORDER — PRENATAL MULTIVITAMIN CH
1.0000 | ORAL_TABLET | Freq: Every day | ORAL | Status: DC
Start: 2020-10-22 — End: 2020-10-24
  Administered 2020-10-22 – 2020-10-23 (×2): 1 via ORAL
  Filled 2020-10-22 (×3): qty 1

## 2020-10-22 MED ORDER — TERBUTALINE SULFATE 1 MG/ML IJ SOLN: 0.2500 mg | Freq: Once | INTRAMUSCULAR | Status: DC | PRN

## 2020-10-22 MED ORDER — BENZOCAINE-MENTHOL 20-0.5 % EX AERO
1.0000 "application " | INHALATION_SPRAY | CUTANEOUS | Status: DC | PRN
  Administered 2020-10-22: 1 via TOPICAL
  Filled 2020-10-22: qty 56

## 2020-10-22 MED ORDER — WITCH HAZEL-GLYCERIN EX PADS: 1.0000 "application " | MEDICATED_PAD | CUTANEOUS | Status: DC | PRN

## 2020-10-22 MED ORDER — ZOLPIDEM TARTRATE 5 MG PO TABS: 5.0000 mg | ORAL_TABLET | Freq: Every evening | ORAL | Status: DC | PRN

## 2020-10-22 MED ORDER — IBUPROFEN 600 MG PO TABS
600.0000 mg | ORAL_TABLET | Freq: Four times a day (QID) | ORAL | Status: DC
Start: 2020-10-22 — End: 2020-10-24
  Administered 2020-10-22 – 2020-10-24 (×8): 600 mg via ORAL
  Filled 2020-10-22 (×9): qty 1

## 2020-10-22 MED ORDER — COCONUT OIL OIL
1.0000 "application " | TOPICAL_OIL | Status: DC | PRN
  Administered 2020-10-22: 1 via TOPICAL

## 2020-10-22 MED ORDER — OXYTOCIN-SODIUM CHLORIDE 30-0.9 UT/500ML-% IV SOLN
1.0000 m[IU]/min | INTRAVENOUS | Status: DC
  Administered 2020-10-22: 2 m[IU]/min via INTRAVENOUS

## 2020-10-22 MED ORDER — SENNOSIDES-DOCUSATE SODIUM 8.6-50 MG PO TABS
2.0000 | ORAL_TABLET | Freq: Every day | ORAL | Status: DC
  Administered 2020-10-23: 2 via ORAL
  Filled 2020-10-22 (×2): qty 2

## 2020-10-22 MED ORDER — TETANUS-DIPHTH-ACELL PERTUSSIS 5-2.5-18.5 LF-MCG/0.5 IM SUSY
0.5000 mL | PREFILLED_SYRINGE | Freq: Once | INTRAMUSCULAR | Status: DC
Start: 2020-10-23 — End: 2020-10-24

## 2020-10-22 MED ORDER — ONDANSETRON HCL 4 MG/2ML IJ SOLN: 4.0000 mg | INTRAMUSCULAR | Status: DC | PRN

## 2020-10-22 MED ORDER — ACETAMINOPHEN 325 MG PO TABS: 650.0000 mg | ORAL_TABLET | ORAL | Status: DC | PRN

## 2020-10-22 MED ORDER — SIMETHICONE 80 MG PO CHEW
80.0000 mg | CHEWABLE_TABLET | ORAL | Status: DC | PRN
  Filled 2020-10-22: qty 1

## 2020-10-22 MED ORDER — DIBUCAINE (PERIANAL) 1 % EX OINT: 1.0000 "application " | TOPICAL_OINTMENT | CUTANEOUS | Status: DC | PRN

## 2020-10-22 NOTE — Lactation Note (Signed)
This note was copied from a baby's chart. Lactation Consultation Note  Patient Name: Michelle Taylor Today's Date: 10/22/2020   Age:30 hours  [employer] Pump form faxed to [place], states mom will receive pump tomorrow by Elana Alm, RN, IBCLC      Charlynn Court 10/22/2020, 7:48 PM

## 2020-10-22 NOTE — Discharge Summary (Signed)
Postpartum Discharge Summary  Date of Service updated 10/24/2020     Patient Name: Michelle Taylor DOB: 22-Apr-1990 MRN: [id number]  Date of admission: 10/21/2020 Delivery date:10/22/2020  Delivering provider: Starr Lake  Date of discharge: 10/24/2020  Admitting diagnosis: Indication for care in labor and delivery, antepartum [O75.9] Intrauterine pregnancy: [redacted]w[redacted]d    Secondary diagnosis:  Active Problems:   Prediabetes   Supervision of high risk pregnancy, antepartum   History of preterm delivery, currently pregnant   History of pre-eclampsia in prior pregnancy, currently pregnant   History of gestational diabetes in prior pregnancy, currently pregnant   Maternal obesity affecting pregnancy, antepartum   GBS (group B Streptococcus carrier), +RV culture, currently pregnant   LGA (large for gestational age) fetus affecting management of mother, third trimester, fetus 1   Indication for care in labor and delivery, antepartum   Chorioamnionitis  Additional problems: None   Discharge diagnosis: Term Pregnancy Delivered                                              Post partum procedures: none Augmentation: Pitocin Complications: None  Hospital course: Onset of Labor With Vaginal Delivery      30y.o. yo [id number] admitted in Active Labor on 10/21/2020. Patient had an uncomplicated labor course as follows: Patient arrived in active labor, SROM on L and D. 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 Patient had persistent anterior lip, was treated with benadryl and low dose pitocin started to achieve adequate contraction patter.  Membrane Rupture Time/Date: 4:45 PM ,10/21/2020   Delivery Method:Vaginal, Spontaneous  Episiotomy: None  Lacerations:  Periurethral  Patient had an uncomplicated postpartum course.  She is ambulating, tolerating a regular diet, passing flatus, and  urinating well. Patient is discharged home in stable condition on 10/24/20.  Newborn Data: Birth date:10/22/2020  Birth time:4:10 AM  Gender:Female  Living status:Living  Apgars:8 ,9  Weight:3105 g   Magnesium Sulfate received: No BMZ received: No Rhophylac:No MMR:No T-DaP:Given prenatally Flu: No Transfusion:No  Physical exam  Vitals:   10/23/20 0502 10/23/20 1443 10/23/20 2118 10/24/20 0610  BP: 100/63 115/71 109/77 115/82  Pulse: 93 74 75 68  Resp: _0 Temp: 98 F (36.7 C) 98 F (36.7 C) 98.7 F (37.1 C) 98.3 F (36.8 C)  TempSrc: Oral Oral Oral Oral  SpO2: 99% 97% 99% 98%  Weight:      Height:       General: alert and no distress Lochia: appropriate Uterine Fundus: firm DVT Evaluation: No evidence of DVT seen on physical exam. Labs: Lab Results  Component Value Date   WBC 7.7 10/21/2020   HGB 12.6 10/21/2020   HCT 39.0 10/21/2020   MCV 89.0 10/21/2020   PLT 361 10/21/2020   CMP Latest Ref Rng & Units 06/04/2020  Glucose 70 - 99 mg/dL 86  BUN 6 - 20 mg/dL 5(L)  Creatinine 0.44 - 1.00 mg/dL 0.57  Sodium 135 - 145 mmol/L 133(L)  Potassium 3.5 - 5.1 mmol/L 4.0  Chloride 98 - 111 mmol/L 102  CO2 22 - 32 mmol/L 20(L)  Calcium 8.9 - 10.3 mg/dL 9.6  Total Protein 6.0 - 8.5 g/dL -  Total Bilirubin 0.0 - 1.2 mg/dL -  Alkaline Phos  44 - 121 IU/L -  AST 0 - 40 IU/L -  ALT 0 - 32 IU/L -   Edinburgh Score: Edinburgh Postnatal Depression Scale Screening Tool 10/22/2020  I have been able to laugh and see the funny side of things. 0  I have looked forward with enjoyment to things. 0  I have blamed myself unnecessarily when things went wrong. 1  I have been anxious or worried for no good reason. 1  I have felt scared or panicky for no good reason. 0  Things have been getting on top of me. 0  I have been so unhappy that I have had difficulty sleeping. 0  I have felt sad or miserable. 0  I have been so unhappy that I have been crying. 0  The thought of  harming myself has occurred to me. 0  Edinburgh Postnatal Depression Scale Total 2     After visit meds:  Allergies as of 10/24/2020   No Known Allergies      Medication List     STOP taking these medications    aspirin EC 81 MG tablet   ondansetron 4 MG disintegrating tablet Commonly known as: Zofran ODT       TAKE these medications    acetaminophen 500 MG tablet Commonly known as: TYLENOL Take 1,000 mg by mouth every 6 (six) hours as needed for mild pain or headache.   Blood Pressure Kit Devi 1 kit by Does not apply route once a week. Check Blood Pressure regularly and record readings into the Babyscripts App.  Large Cuff.  DX O90.0   Gojji Weight Scale Misc 1 Device by Does not apply route daily as needed.   ibuprofen 600 MG tablet Commonly known as: ADVIL Take 1 tablet (600 mg total) by mouth every 6 (six) hours.   Vitafol Ultra 29-0.6-0.4-200 MG Caps Take 1 capsule by mouth daily.         Discharge home in stable condition Infant Feeding: Breast Infant Disposition:home with mother Discharge instruction: per After Visit Summary and Postpartum booklet. Activity: Advance as tolerated. Pelvic rest for 6 weeks.  Diet: routine diet Future Appointments: Future Appointments  Date Time Provider [place]  11/22/2020 10:15 AM Woodroe Mode, MD [place] None   Follow up Visit: Please schedule this patient for a Virtual postpartum visit in 6 weeks with the following provider: Any provider. 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

## 2020-10-22 NOTE — Lactation Note (Signed)
This note was copied from a baby's chart. Lactation Consultation Note  Patient Name: Michelle Taylor Today's Date: 10/22/2020   Age:30 hours  Mother reports that she has [employer]. She reports that she can get a pump through the hospital.  Mother was given the [facility] Pump form to get signed by her provider.   Maternal Data    Feeding    LATCH Score                    Lactation Tools Discussed/Used    Interventions    Discharge    Consult Status      Michel Bickers 10/22/2020, 3:19 PM

## 2020-10-22 NOTE — Anesthesia Postprocedure Evaluation (Signed)
Anesthesia Post Note  Patient: Michelle Taylor  Procedure(s) Performed: AN AD HOC LABOR EPIDURAL     Patient location during evaluation: Mother Baby Anesthesia Type: Epidural Level of consciousness: awake and alert Pain management: pain level controlled Vital Signs Assessment: post-procedure vital signs reviewed and stable Respiratory status: spontaneous breathing, nonlabored ventilation and respiratory function stable Cardiovascular status: stable Postop Assessment: no headache, no backache and epidural receding Anesthetic complications: no   No notable events documented.  Last Vitals:  Vitals:   10/22/20 0742 10/22/20 1306  BP: 112/79 106/78  Pulse: (!) 121 (!) 117  Resp: 20 18  Temp: 36.9 C 37 C  SpO2: 99% 100%    Last Pain:  Vitals:   10/22/20 1306  TempSrc: Oral  PainSc:    Pain Goal:                   Michelle Taylor

## 2020-10-22 NOTE — Progress Notes (Signed)
   Michelle Taylor is a 30 y.o. [phone] at [redacted]w[redacted]d  admitted for active labor and SROM  Subjective: Patient doing well, feeling like she has urge to push.   Objective: Vitals:   10/22/20 0052 10/22/20 0100 10/22/20 0130 10/22/20 0230  BP:  118/71 117/79 112/71  Pulse:  (!) 124 (!) 131 (!) 125  Resp:   18 20  Temp: (!) 101.3 F (38.5 C)     TempSrc: Axillary     SpO2:      Weight:      Height:       Total I/O In: -  Out: 150 [Urine:150]  FHT:  FHR: 150 bpm, variability: moderate,  accelerations:  Present,  decelerations:  Absent UC:   irregular, every 1-5 minutes SVE:   Dilation: Lip/rim Effacement (%): 70 Station: Plus 1 Exam by:: Michelle Taylor, CNM Pitocin @ mu/min  Labs: Lab Results  Component Value Date   WBC 7.7 10/21/2020   HGB 12.6 10/21/2020   HCT 39.0 10/21/2020   MCV 89.0 10/21/2020   PLT 361 10/21/2020    Assessment / Plan: Patient still with persistent lip on right side, although reducible with pushing. Contractions have spaced out so will start low dose pitocin to get into more favorable pattern .  -patient is s//p multiple repositioning including hands and knees and Benadryl -patient s/p abx for Triple I -FSE in place -Explained to patient reason for pitocin; she is amenable  Labor:  will start low dose pitocin Fetal Wellbeing:  Category I Pain Control:  Epidural Anticipated MOD:  NSVD  Michelle Taylor Michelle Taylor 10/22/2020, 2:34 AM

## 2020-10-22 NOTE — Lactation Note (Addendum)
This note was copied from a baby's chart. Lactation Consultation Note  Patient Name: Michelle Taylor [id number] Date: 10/22/2020 Reason for consult: Initial assessment Age:30 hours P2, Mother reports that she breastfed her first child for 4-5 months. Mother reports that she had a low milk supply, due to a birth control pill she was taking.   Mother reports that infant has been feeding well. Lactation Brochure given. Basic teaching reviewed. Mother taught to do hand expression.  Infant was latched in cradle hold when I arrived . Infant sustained latch for 20 mins. When infant un-latched from  the breast , obs that infant had been pinching Mothers nipple. Mother taught to do cross cradle hold.  Discussed cue base feeding and cluster feeding.  Mother reports that she has a [employer] pump at home.  Mother suggested to continue to do STS and to feed infant 8-12 or more times in 24 hours.     Maternal Data Has patient been taught Hand Expression?: Yes Does the patient have breastfeeding experience prior to this delivery?: Yes How long did the patient breastfeed?: 4-5 months  Feeding Mother's Current Feeding Choice: Breast Milk  LATCH Score Latch: Grasps breast easily, tongue down, lips flanged, rhythmical sucking.  Audible Swallowing: Spontaneous and intermittent  Type of Nipple: Everted at rest and after stimulation  Comfort (Breast/Nipple): Filling, red/small blisters or bruises, mild/mod discomfort  Hold (Positioning): Assistance needed to correctly position infant at breast and maintain latch.  LATCH Score: 8   Lactation Tools Discussed/Used    Interventions Interventions: Assisted with latch;Adjust position;Breast feeding basics reviewed;Hand express;Breast compression;Education  Discharge Pump:  (mom Cozi wirless) WIC Program: Yes  Consult Status Consult Status: Follow-up Date: 10/22/20 Follow-up type: In-patient    Stevan Born [facility] 10/22/2020, 10:03  AM

## 2020-10-23 NOTE — Progress Notes (Signed)
Post Partum Day 1 Subjective: Michelle Taylor reports no pain. She has been up ad lib, urinating, and has flatus but no BM. She is tolerating PO without problem. She has minimal lochia.   Objective: Blood pressure 100/63, pulse 93, temperature 98 F (36.7 C), temperature source Oral, resp. rate 18, height 5' (1.524 m), weight 86.2 kg, last menstrual period 01/20/2020, SpO2 99 %, unknown if currently breastfeeding.  Physical Exam:  General: alert and no distress Lochia: appropriate Uterine Fundus: firm Incision: N/A DVT Evaluation: No evidence of DVT seen on physical exam. No cords or calf tenderness. No significant calf/ankle edema.  Recent Labs    10/21/20 1627  HGB 12.6  HCT 39.0    Assessment/Plan: Plan for discharge tomorrow - Breastfeeding  - Nexplanon at 6 week PP appt.  - Desires circumcision for baby boy  - Sinus tachycardia observed on EKG, patient's baseline appears to be in high 90s, no further intervention needed at this time     LOS: 2 days   Michelle Taylor 10/23/2020, 8:43 AM

## 2020-10-23 NOTE — Lactation Note (Addendum)
This note was copied from a baby's chart. Lactation Consultation Note  Patient Name: Boy Kiante Petrovich [id number] Date: 10/23/2020 Reason for consult: Follow-up assessment Age:30 hours   LC in to room for follow up. Mother states breastfeeding is going well. Per mother, infant is clusterfeeding and is eager when going to breast. Infant seems to have appropriate output per age. Mother is applying coconut oil to nipples for lubrication. Mother reports no concerns.  Total bili is low intermediate risk.  Plan: 1-Skin to skin 2-Aim for a deep, comfortable latch 3-Breastfeeding on demand or 8-12 times in 24h period. 4-Keep infant awake during breastfeeding session: massaging breast, infant's hand/shoulder/feet 5-Monitor voids and stools as signs good intake.  6-Encouraged maternal rest, hydration and food intake.  7-Contact LC as needed for feeds/support/concerns/questions   All questions answered at this time.    Feeding Mother's Current Feeding Choice: Breast Milk  LATCH Score Latch: Grasps breast easily, tongue down, lips flanged, rhythmical sucking.  Audible Swallowing: A few with stimulation  Type of Nipple: Everted at rest and after stimulation  Comfort (Breast/Nipple): Soft / non-tender  Hold (Positioning): No assistance needed to correctly position infant at breast.  LATCH Score: 9   Lactation Tools Discussed/Used Tools: Pump Breast pump type: Manual Pumping frequency: as needed  Interventions Interventions: Breast feeding basics reviewed;Coconut oil;Education;Skin to skin  Discharge    Consult Status Consult Status: Follow-up Date: 10/24/20 Follow-up type: In-patient    Lianna Sitzmann A Higuera Ancidey 10/23/2020, 4:55 PM

## 2020-10-24 ENCOUNTER — Encounter: Payer: Medicaid Other | Admitting: Obstetrics & Gynecology

## 2020-10-24 MED ORDER — IBUPROFEN 600 MG PO TABS: 600.0000 mg | ORAL_TABLET | Freq: Four times a day (QID) | ORAL | 0 refills | Status: AC

## 2020-10-24 NOTE — Discharge Instructions (Signed)
-  take tylenol 1000 mg every 6 hours as needed for pain, alternate with ibuprofen 600 mg every 6 hours -drink plenty of water to help with breastfeeding -continue prenatal vitamins while you are breastfeeding -think about birth control options-->bedisider.org is a great website! You can get any form of birth control from the health department for free  

## 2020-10-24 NOTE — Lactation Note (Signed)
This note was copied from a baby's chart. Lactation Consultation Note  Patient Name: Michelle Taylor Date: 10/24/2020 Reason for consult: Follow-up assessment;Early term 37-38.6wks;Infant weight loss;Other (Comment);Nipple pain/trauma (post circ, per mom Breastfeeding going well and milk is coming in. [facility] reviewed D/C teaching for BF and answered parents questions.) Age:30 hours  Maternal Data Has patient been taught Hand Expression?: Yes  Feeding Mother's Current Feeding Choice: Breast Milk  LATCH Score                    Lactation Tools Discussed/Used Tools: Shells Pump Education: Milk Storage  Interventions Interventions: Breast feeding basics reviewed;Shells;Education  Discharge Discharge Education: Engorgement and breast care;Warning signs for feeding baby Pump: Personal;Manual  Consult Status Consult Status: Complete Date: 10/24/20    Kathrin Greathouse 10/24/2020, 2:40 PM

## 2020-11-02 ENCOUNTER — Telehealth (HOSPITAL_COMMUNITY): Payer: Self-pay | Admitting: *Deleted

## 2020-11-02 NOTE — Telephone Encounter (Signed)
Hospital Discharge Follow-Up Call:  Patient reports that she is doing well and has no concerns about her healing.  EPDS score today was 2 and patient feels this accurately reflects that she is doing well emotionally.  Patient declines postpartum group information.  Patient reports that baby is well and that baby is breastfeeding and getting formula supplementation per pediatrician's recommendation since baby had not gained weight at last appointment.  They will take baby for another weight check on Aug 8.  She reports that baby sleeps in a bassinet and she is familiar with the ABCs of Safe Sleep.  She has no concerns about baby's health at this time.

## 2020-11-22 ENCOUNTER — Encounter: Payer: Self-pay | Admitting: Obstetrics & Gynecology

## 2020-11-22 ENCOUNTER — Ambulatory Visit (INDEPENDENT_AMBULATORY_CARE_PROVIDER_SITE_OTHER): Payer: Medicaid Other | Admitting: Obstetrics & Gynecology

## 2020-11-22 ENCOUNTER — Other Ambulatory Visit: Payer: Self-pay

## 2020-11-22 VITALS — BP 112/76 | HR 98 | Ht 60.0 in | Wt 171.0 lb

## 2020-11-22 DIAGNOSIS — Z30013 Encounter for initial prescription of injectable contraceptive: Secondary | ICD-10-CM | POA: Diagnosis not present

## 2020-11-22 MED ORDER — NORETHINDRONE 0.35 MG PO TABS
1.0000 | ORAL_TABLET | Freq: Every day | ORAL | 11 refills | Status: DC
Start: 2020-11-22 — End: 2021-06-04

## 2020-11-22 NOTE — Progress Notes (Signed)
    Post Partum Visit Note  Michelle Taylor is a 30 y.o. [phone] female who presents for a postpartum visit. She is 4 weeks postpartum following a normal spontaneous vaginal delivery.  I have fully reviewed the prenatal and intrapartum course. The delivery was at 38w gestational weeks.  Anesthesia: epidural. Postpartum course has been unremarkable. Baby is doing well. Baby is feeding by breast. Bleeding no bleeding. Bowel function is normal. Bladder function is normal. Patient is not sexually active. Contraception method is oral progesterone-only contraceptive. Postpartum depression screening: negative. EPDS: 0   The pregnancy intention screening data noted above was reviewed. Potential methods of contraception were discussed. The patient elected to proceed with No data recorded.     The following portions of the patient's history were reviewed and updated as appropriate: allergies, current medications, past family history, past medical history, past social history, past surgical history, and problem list.  Review of Systems Pertinent items are noted in HPI.    Objective:  LMP 01/20/2020 (Exact Date)    General:  alert, cooperative, and no distress   Breasts:    Lungs: Effort normal  Heart:  RRR  Abdomen: soft, non-tender; bowel sounds normal; no masses,  no organomegaly   Vulva:  not evaluated  Vagina: not evaluated  Cervix:    Corpus:   Adnexa:    Rectal Exam: Not performed.        Assessment:    normal postpartum exam. Pap smear not done at today's visit.   Plan:   Essential components of care per ACOG recommendations:  1.  Mood and well being: Patient with negative depression screening today. Reviewed local resources for support.  - Patient does not use tobacco.  - hx of drug use? No   2. Infant care and feeding:  -Patient currently breastmilk feeding? Yes If breastmilk feeding discussed return to work and pumping. If needed, patient was provided letter for work to  allow for every 2-3 hr pumping breaks, and to be granted a private location to express breastmilk and refrigerated area to store breastmilk. Reviewed importance of draining breast regularly to support lactation. -Social determinants of health (SDOH) reviewed in EPIC. No concerns  3. Sexuality, contraception and birth spacing - Patient does not want a pregnancy in the next year.  Desired family size is 2 children.  - Reviewed forms of contraception in tiered fashion. Patient desired oral progesterone-only contraceptive today.   - Discussed birth spacing of 18 months  4. Sleep and fatigue -Encouraged family/partner/community support of 4 hrs of uninterrupted sleep to help with mood and fatigue  5. Physical Recovery  - Discussed patients delivery - Patient had a 1 degree laceration, perineal healing reviewed. Patient expressed understanding - Patient has urinary incontinence? No  - Patient is safe to resume physical and sexual activity  6.  Health Maintenance - Last pap smear done 04/2020 and was normal    7. No Chronic Disease   Adam Phenix, MD  Center for [facility], [facility]

## 2021-02-15 IMAGING — US US PELVIS COMPLETE WITH TRANSVAGINAL
1 series · 15 of 25 positions shown · non-contrast
Comparison: None

CLINICAL DATA: Abnormal uterine bleeding; LMP 11/12/2018



[Series 1: us pelvis complete with transvaginal · 15 of 40 slices shown]
[im 1/40]
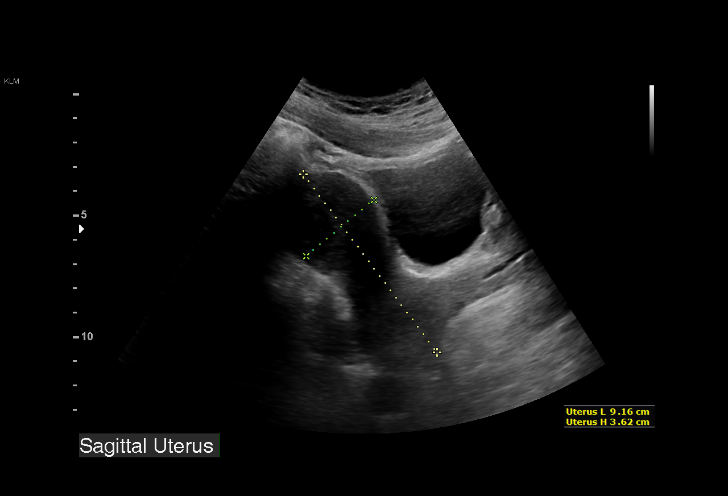
[im 4/40]
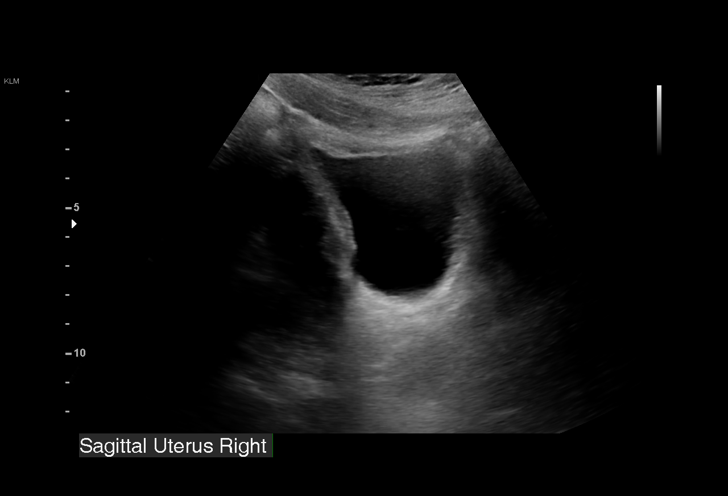
[im 7/40]
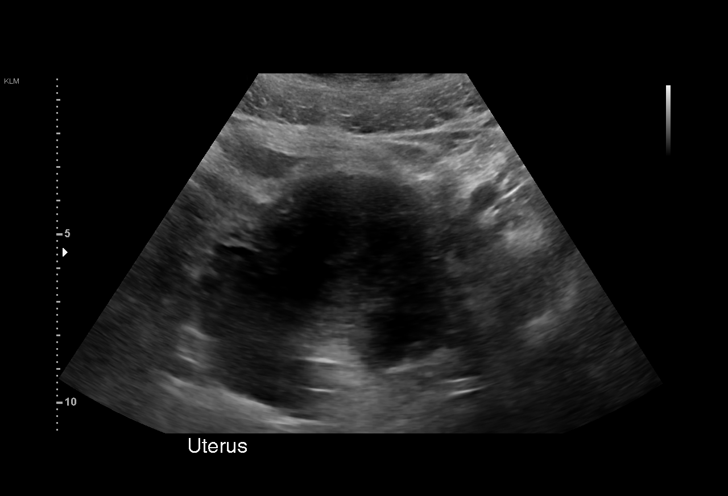
[im 9/40]
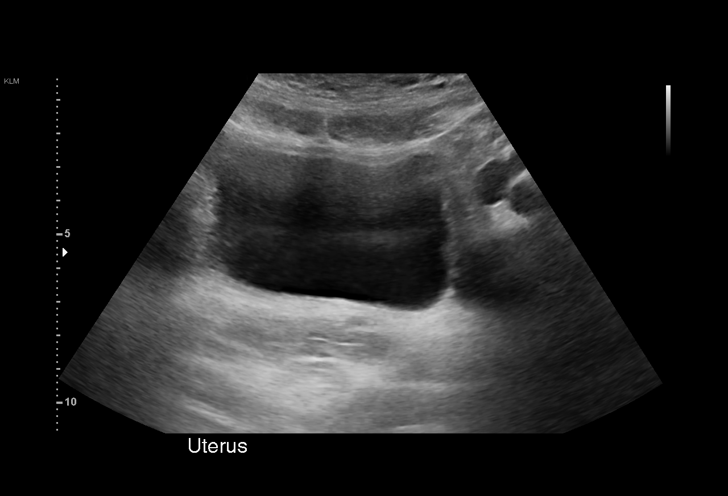
[im 12/40]
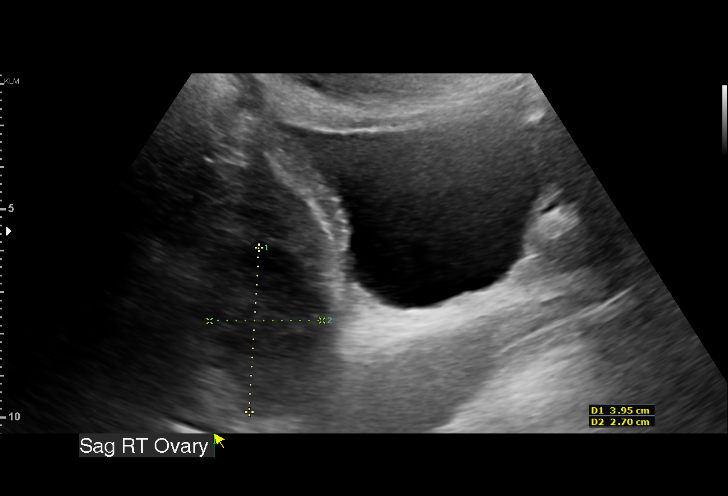
[im 15/40]
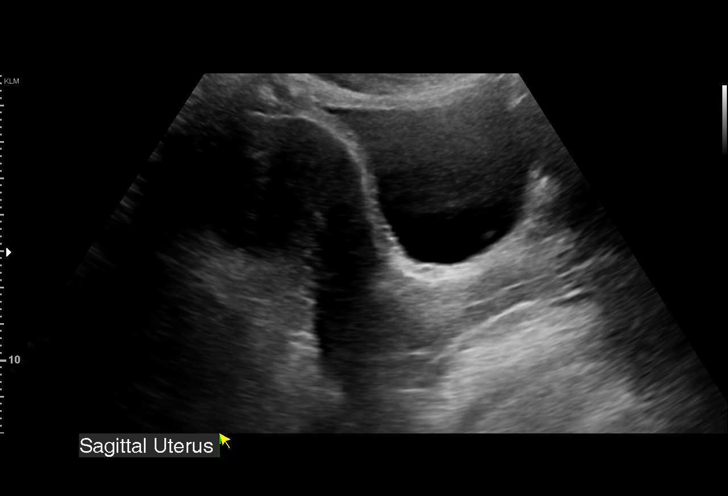
[im 17/40]
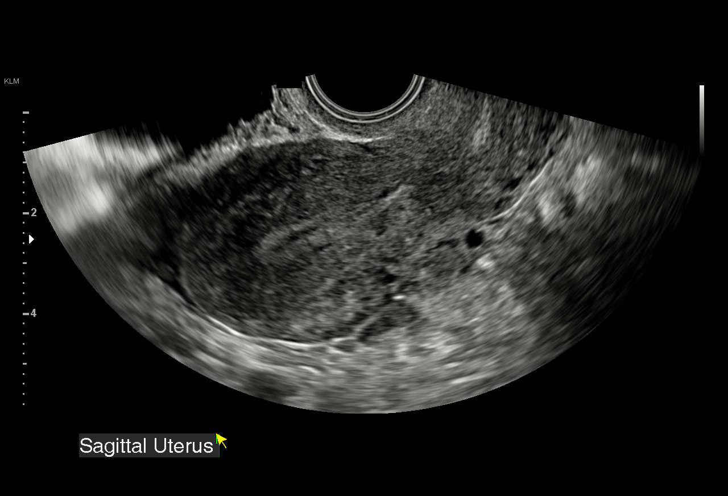
[im 20/40]
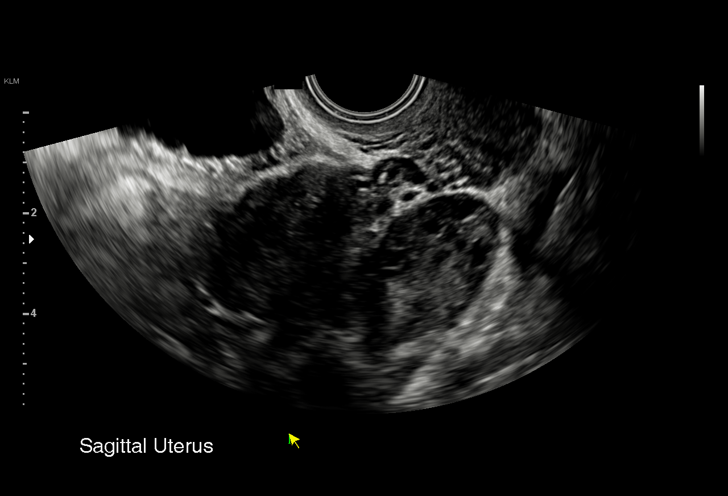
[im 23/40]
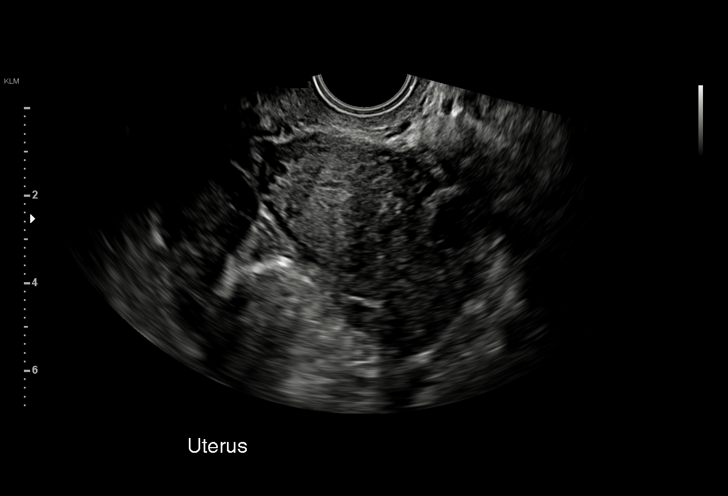
[im 25/40]
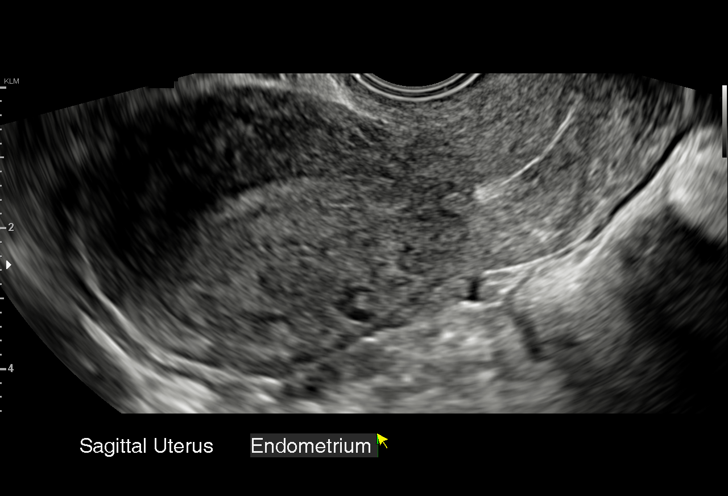
[im 28/40]
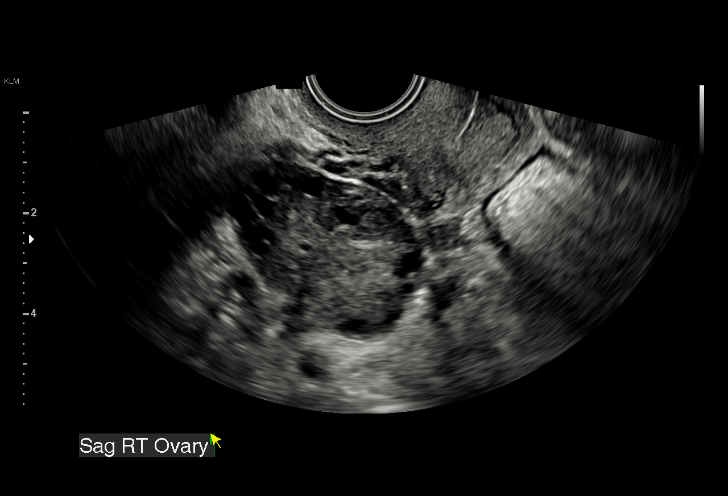
[im 31/40]
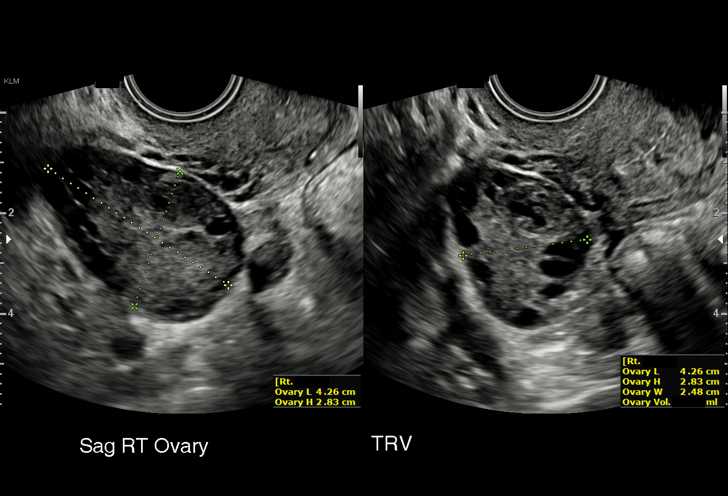
[im 33/40]
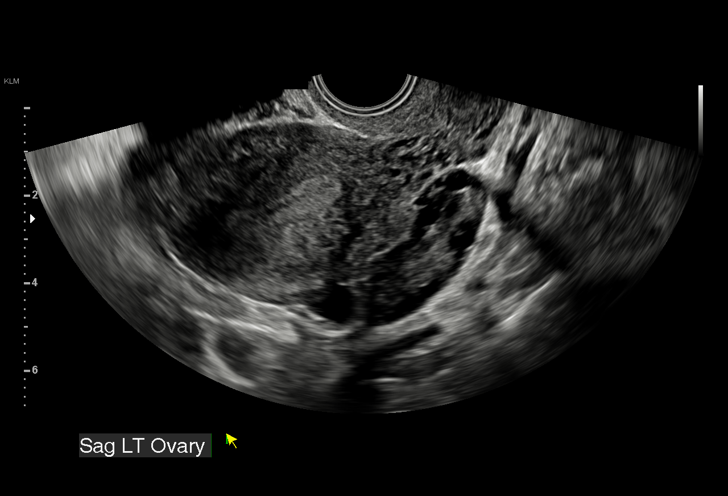
[im 36/40]
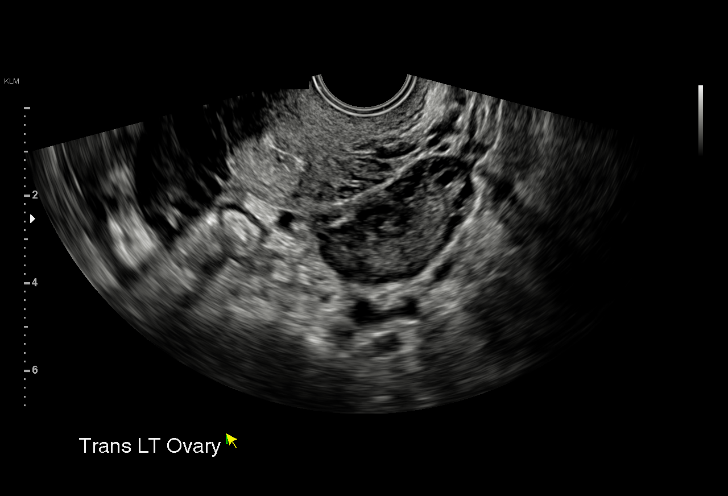
[im 40/40]
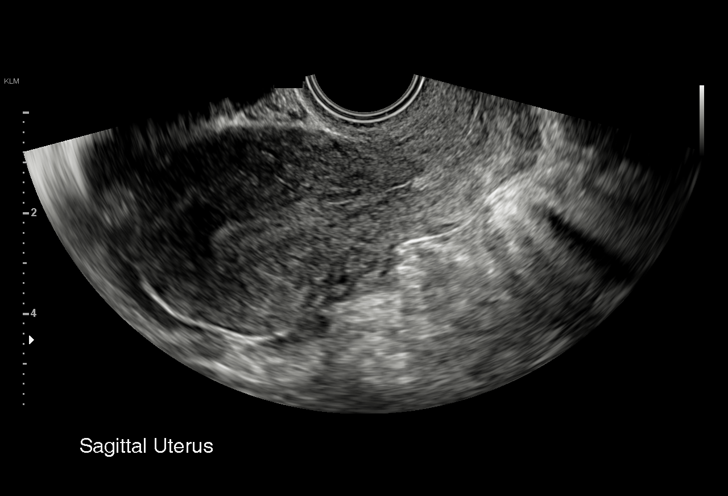

[15 of 25 positions shown; findings below may reference images not displayed]

FINDINGS: Uterus

Measurements: 9.2 x 3.6 x 5.1 cm = volume: 89 mL. Anteverted.
Minimally heterogeneous myometrium. No focal mass.

Endometrium

Thickness: 8 mm.  No endometrial fluid or focal abnormality

Right ovary

Measurements: 4.3 x 2.8 x 2.5 cm = volume: 15.7 mL. Normal
morphology without mass

Left ovary

Measurements: 4.8 x 1.9 x 2.6 cm = volume: 12.7 mL. Normal
morphology without mass

Other findings

No free pelvic fluid or adnexal masses.
IMPRESSION: Normal exam.

## 2021-05-15 ENCOUNTER — Ambulatory Visit (INDEPENDENT_AMBULATORY_CARE_PROVIDER_SITE_OTHER): Payer: Medicaid Other | Admitting: Obstetrics

## 2021-05-15 ENCOUNTER — Ambulatory Visit: Payer: Medicaid Other | Admitting: Obstetrics

## 2021-05-15 ENCOUNTER — Encounter: Payer: Self-pay | Admitting: Obstetrics

## 2021-05-15 ENCOUNTER — Other Ambulatory Visit: Payer: Self-pay

## 2021-05-15 VITALS — BP 134/86 | HR 89 | Ht 60.0 in | Wt 176.0 lb

## 2021-05-15 DIAGNOSIS — Z3202 Encounter for pregnancy test, result negative: Secondary | ICD-10-CM

## 2021-05-15 DIAGNOSIS — Z30017 Encounter for initial prescription of implantable subdermal contraceptive: Secondary | ICD-10-CM

## 2021-05-15 LAB — POCT URINE PREGNANCY: Preg Test, Ur: NEGATIVE

## 2021-05-15 NOTE — Progress Notes (Signed)
Here for Nexplanon placement. Right hand dominant.

## 2021-05-15 NOTE — Progress Notes (Signed)
Patient had unprotected intercourse 3 days ago.  Procedure cancelled, and the patient will return in 2 weeks with abstinence from intercourse, and have the procecure done with a negative UPT.  Brock Bad, MD 05/15/2021 12:54 PM

## 2021-05-29 ENCOUNTER — Other Ambulatory Visit: Payer: Self-pay

## 2021-05-29 ENCOUNTER — Ambulatory Visit: Payer: Self-pay | Admitting: Obstetrics

## 2021-06-04 ENCOUNTER — Encounter: Payer: Self-pay | Admitting: Obstetrics and Gynecology

## 2021-06-04 ENCOUNTER — Other Ambulatory Visit: Payer: Self-pay

## 2021-06-04 ENCOUNTER — Ambulatory Visit (INDEPENDENT_AMBULATORY_CARE_PROVIDER_SITE_OTHER): Payer: BC Managed Care – PPO | Admitting: Obstetrics and Gynecology

## 2021-06-04 VITALS — BP 144/95 | HR 94 | Ht 60.0 in | Wt 178.6 lb

## 2021-06-04 DIAGNOSIS — Z30017 Encounter for initial prescription of implantable subdermal contraceptive: Secondary | ICD-10-CM

## 2021-06-04 LAB — POCT URINE PREGNANCY: Preg Test, Ur: NEGATIVE

## 2021-06-04 MED ORDER — ETONOGESTREL 68 MG ~~LOC~~ IMPL
68.0000 mg | DRUG_IMPLANT | Freq: Once | SUBCUTANEOUS | Status: AC
  Administered 2021-06-04: 68 mg via SUBCUTANEOUS

## 2021-06-04 NOTE — Progress Notes (Signed)
? ? ? ?  GYNECOLOGY OFFICE PROCEDURE NOTE ? ?Michelle Taylor is a 31 y.o. [id number] here for Nexplanon insertion.  Last pap smear was on 04/11/20 and was normal.  No other gynecologic concerns. ? ?Nexplanon Insertion Procedure ?Patient identified, informed consent performed, consent signed.   Patient does understand that irregular bleeding is a very common side effect of this medication. She was advised to have backup contraception for one week after placement. Pregnancy test in clinic today was negative.  Appropriate time out taken.  Patient's left arm was prepped and draped in the usual sterile fashion. The ruler used to measure and mark insertion area.  Patient was prepped with alcohol swab and then injected with 3 ml of 1% lidocaine.  She was prepped with betadine, Nexplanon removed from packaging,  Device confirmed in needle, then inserted full length of needle and withdrawn per handbook instructions. Nexplanon was able to palpated in the patient's arm; patient palpated the insert herself. There was minimal blood loss.  Patient insertion site covered with guaze and a pressure bandage to reduce any bruising.  The patient tolerated the procedure well and was given post procedure instructions.   She was advised to have backup contraception for one week.    ? ? ?Michelle Aloe, MD, FACOG ?Obstetrician Heritage manager, Faculty Practice ?Center for [employer], [facility] ? ? ?  ?

## 2021-06-04 NOTE — Progress Notes (Signed)
Pt in office for Nexplanon insertion. She dose not have any concerns today.  ?

## 2022-10-16 IMAGING — US US MFM OB FOLLOW-UP
1 series · 13 of 25 positions shown · non-contrast
Comparison: none

[Series 1: us mfm ob follow-up · 25 acquisitions, 13 frames shown]
[im 1/25]
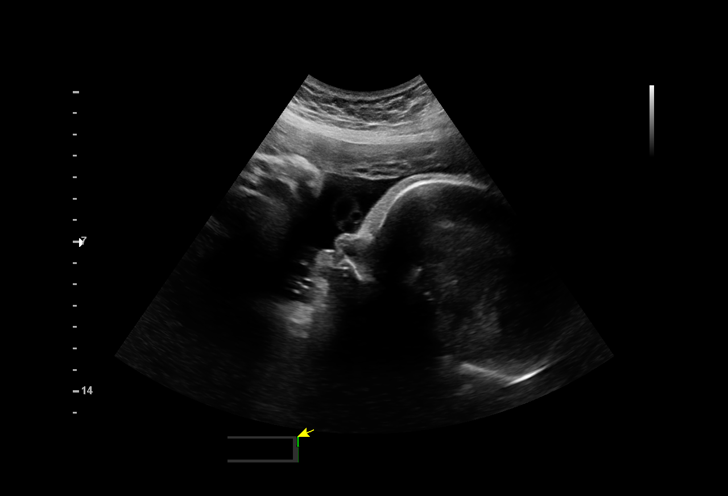
[im 3/25]
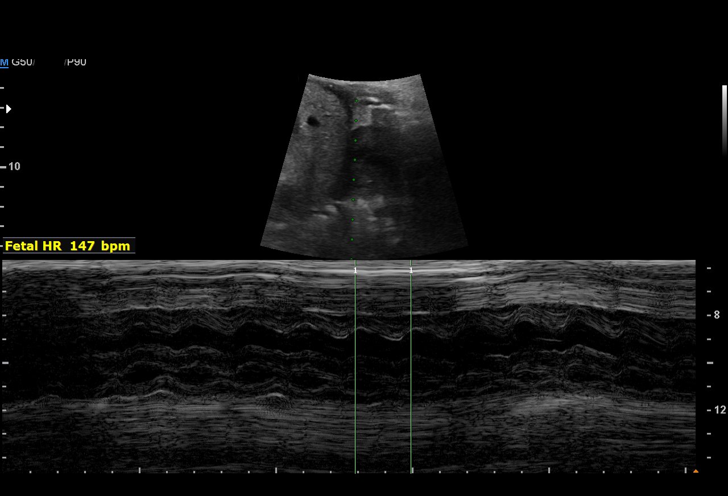
[im 5/25]
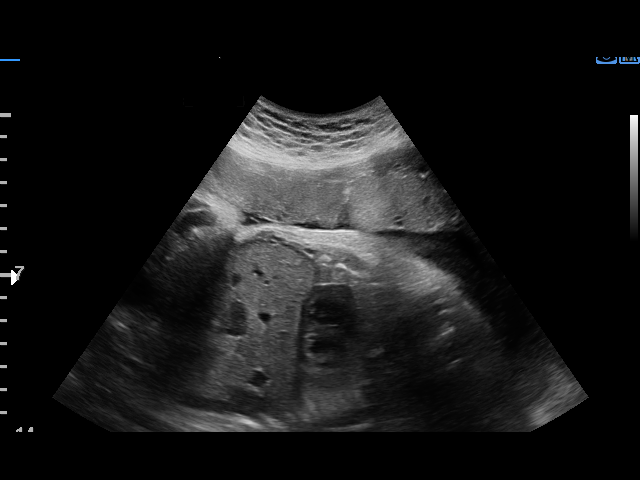
[im 7/25]
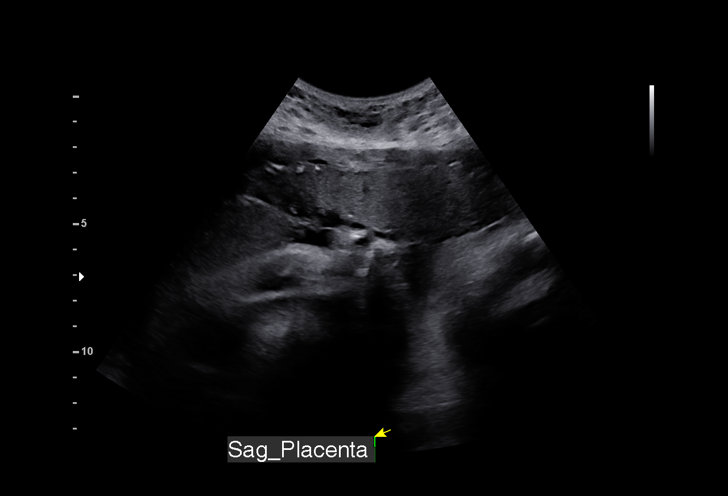
[im 9/25]
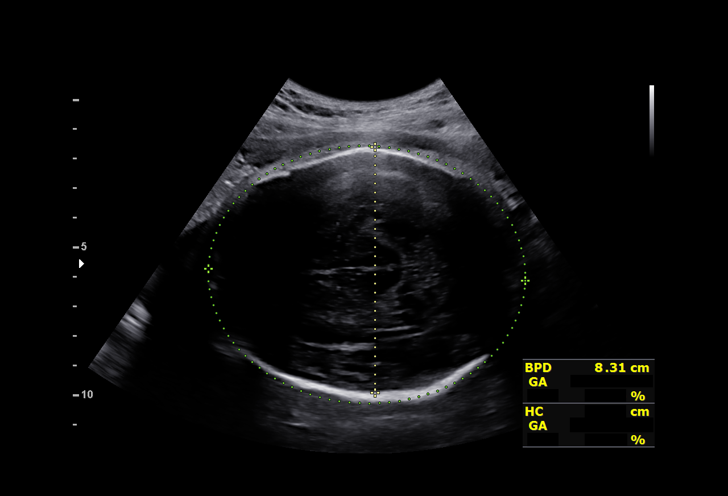
[im 11/25]
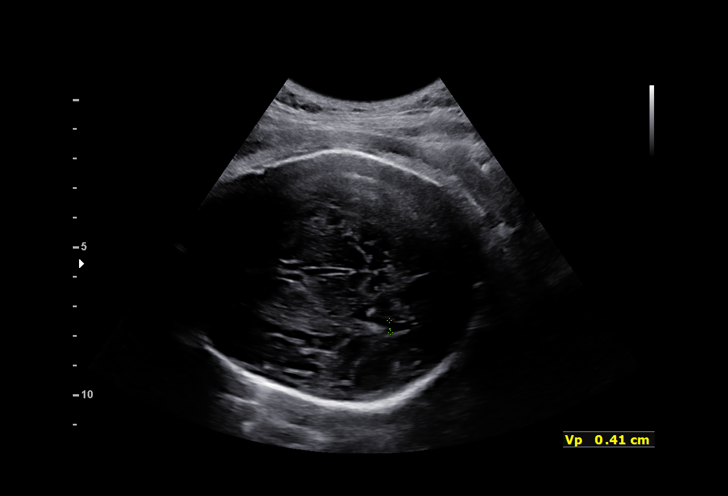
[im 13/25]
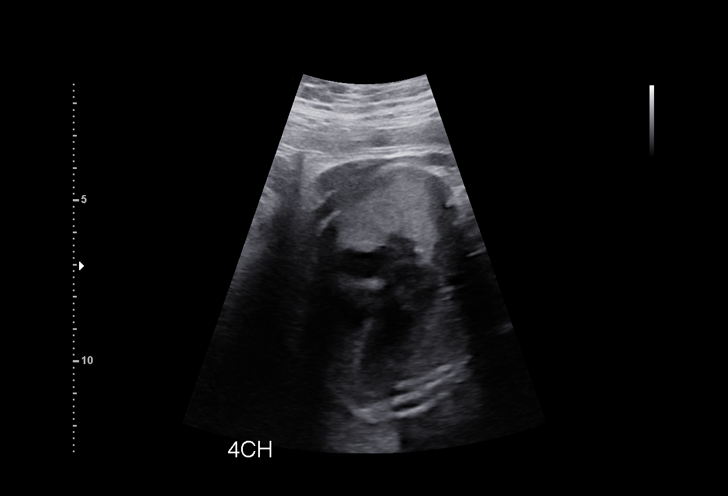
[im 15/25]
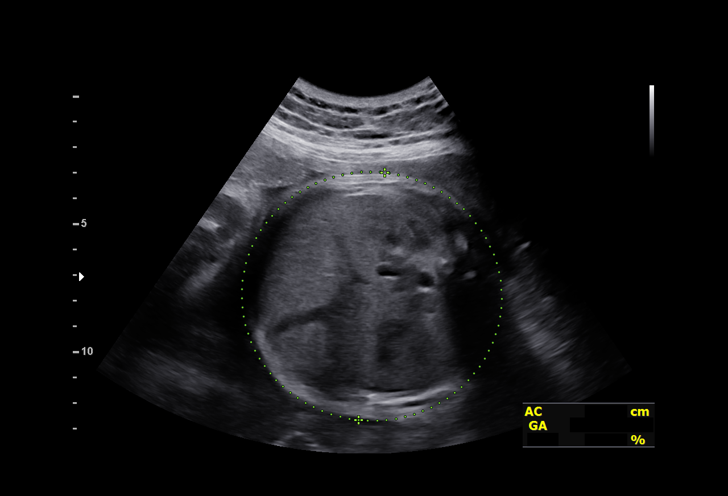
[im 17/25]
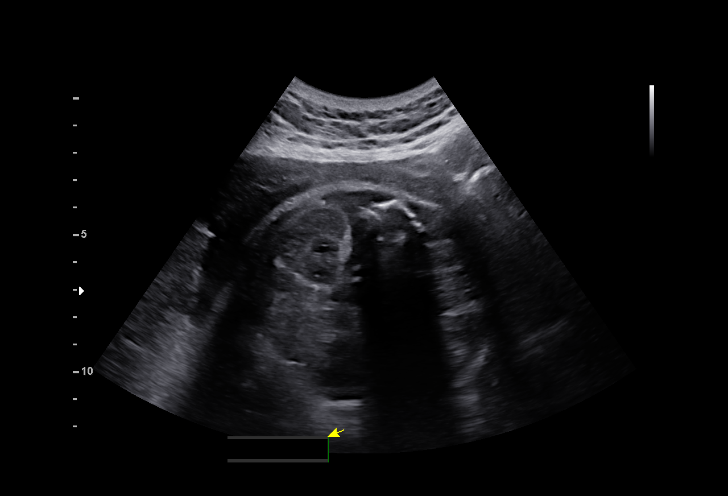
[im 19/25]
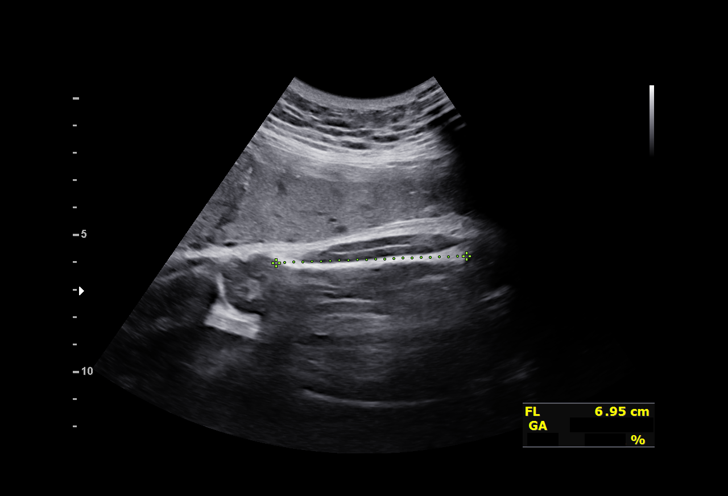
[im 21/25]
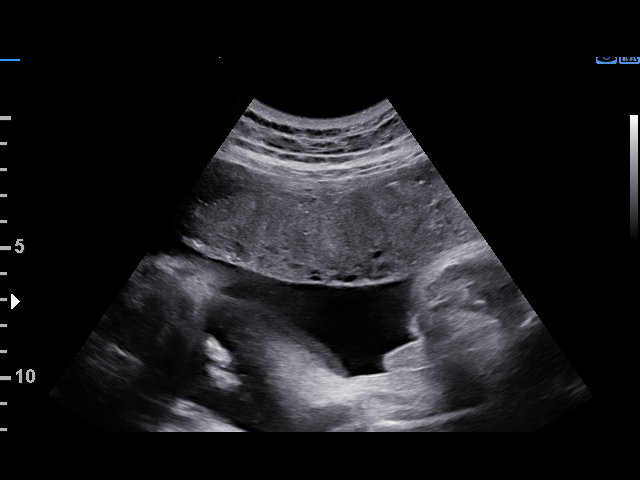
[im 23/25]
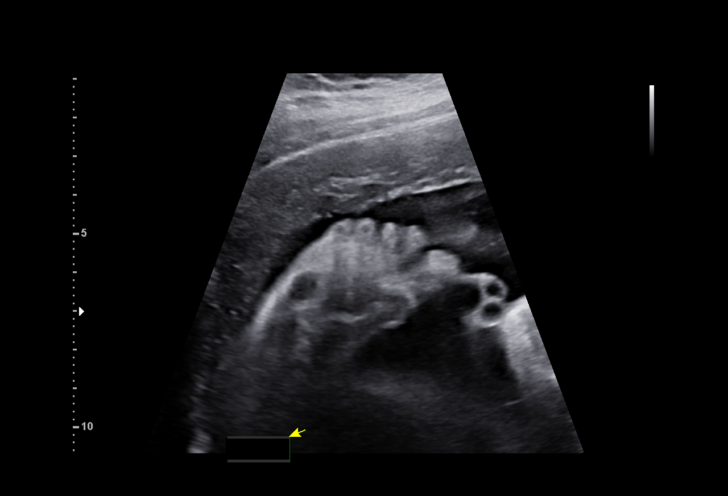
[im 25/25]
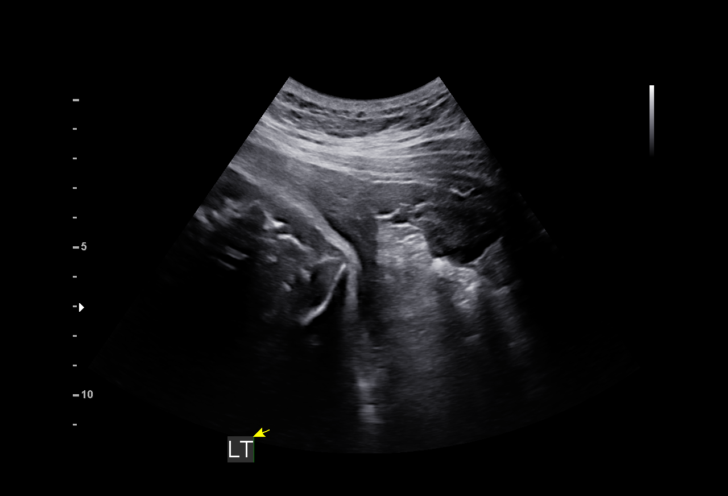

[13 of 25 positions shown; findings below may reference images not displayed]

Indications

 33 weeks gestation of pregnancy
 Obesity complicating pregnancy, third
 trimester
 Poor obstetric history: Previous preterm
 delivery, antepartum (35 wks)
 Low risk NIPS, neg AFP/Horizon
 Absent nasal bone
Fetal Evaluation

 Num Of Fetuses:         1
 Fetal Heart Rate(bpm):  147
 Cardiac Activity:       Observed
 Presentation:           Cephalic
 Placenta:               Anterior
 P. Cord Insertion:      Previously Visualized

 Amniotic Fluid
 AFI FV:      Within normal limits

 AFI Sum(cm)     %Tile       Largest Pocket(cm)
 21.95           84

 RUQ(cm)       RLQ(cm)       LUQ(cm)        LLQ(cm)

Biophysical Evaluation
 Amniotic F.V:   Within normal limits       F. Tone:        Observed
 F. Movement:    Observed                   Score:          [DATE]
 F. Breathing:   Observed
Biometry

 BPD:      83.3  mm     G. Age:  33w 4d         52  %    CI:        72.65   %    70 - 86
                                                         FL/HC:      21.8   %    19.9 -
 HC:      310.8  mm     G. Age:  34w 5d         51  %    HC/AC:      0.99        0.96 -
 AC:      313.2  mm     G. Age:  35w 2d         94  %    FL/BPD:     81.5   %    71 - 87
 FL:       67.9  mm     G. Age:  34w 6d         80  %    FL/AC:      21.7   %    20 - 24

 LV:        4.1  mm

 Est. FW:    7986  gm    5 lb 10 oz      87  %
OB History

 Gravidity:    3         Term:   0        Prem:   1        SAB:   1
 TOP:          0       Ectopic:  0        Living: 1
Gestational Age

 LMP:           34w 4d        Date:  01/20/20                 EDD:   10/26/20
 U/S Today:     34w 4d                                        EDD:   10/26/20
 Best:          33w 2d     Det. By:  U/S C R L  (04/04/20)    EDD:   11/04/20
Anatomy

 Cranium:               Appears normal         LVOT:                   Previously seen
 Cavum:                 Previously seen        Aortic Arch:            Previously seen
 Ventricles:            Appears normal         Ductal Arch:            Previously seen
 Choroid Plexus:        Previously seen        Diaphragm:              Previously seen
 Cerebellum:            Previously seen        Stomach:                Appears normal, left
                                                                       sided
 Posterior Fossa:       Previously seen        Abdomen:                Previously seen
 Nuchal Fold:           Previously seen        Abdominal Wall:         Previously seen
 Face:                  Absent nasal bone      Cord Vessels:           Previously seen
 Lips:                  Previously seen        Kidneys:                Appear normal
 Palate:                Previously seen        Bladder:                Appears normal
 Thoracic:              Previously seen        Spine:                  Previously seen
 Heart:                 Appears normal         Upper Extremities:      Previously seen
                        (4CH, axis, and
                        situs)
 RVOT:                  Previously seen        Lower Extremities:      Previously seen

 Other:  Heels/feet and open hands/5th digits previously visualized. Lenses
         and nasal bone previously visualized. VC, 3VV and 3VTV previously
         visualized. Male gender previously seen.
Cervix Uterus Adnexa

 Cervix
 Not visualized (advanced GA >78wks)

 Right Ovary
 Visualized.
 Left Ovary
 Visualized.
Impression

 Patient to return to for fetal growth assessment.  Her previous
 pregnancy was complicated by preeclampsia.  She does not
 have gestational diabetes.
 Blood pressure today at her office is 123/73 mmHg.
 Fetal growth is appropriate for gestational age.  Amniotic fluid
 is normal and good fetal activity seen. Incidentally observed
 BPP is reassuring ([DATE]) .
 We reassured the patient of the findings.

Recommendations

 -An appointment was made for her to return in 4 weeks for
 fetal growth assessment (history of preeclampsia).
                 Jumper, Nya

## 2022-11-13 IMAGING — US US MFM OB FOLLOW-UP
1 series · 14 of 23 positions shown · non-contrast
Comparison: none

[Series 1: us mfm ob follow-up · 14 of 23 slices shown]
[im 1/23]
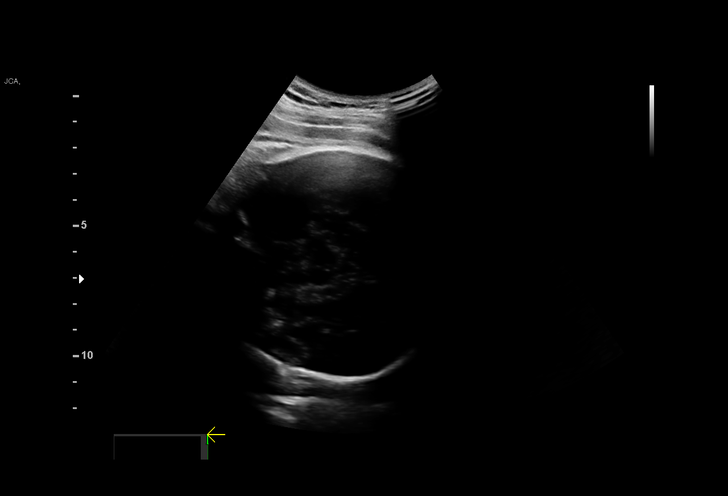
[im 3/23]
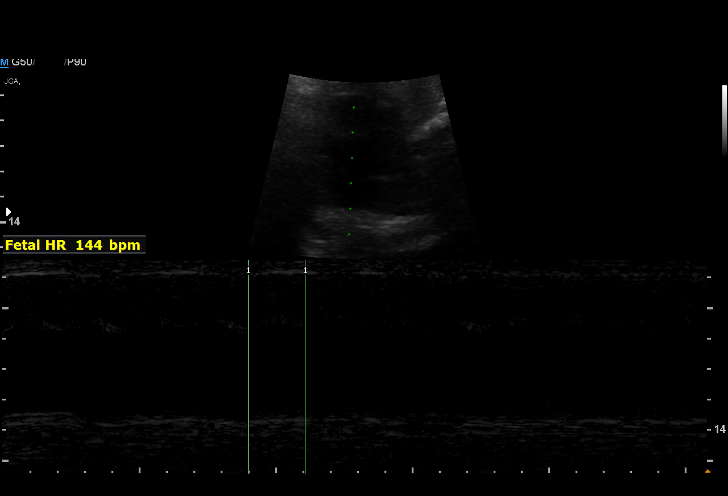
[im 5/23]
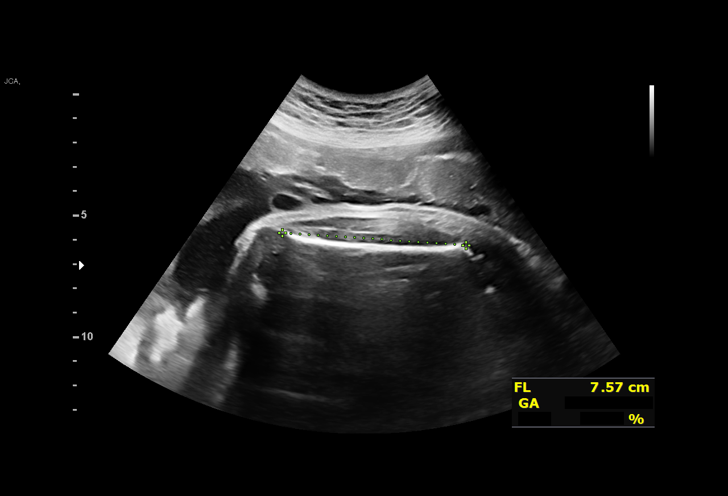
[im 6/23]
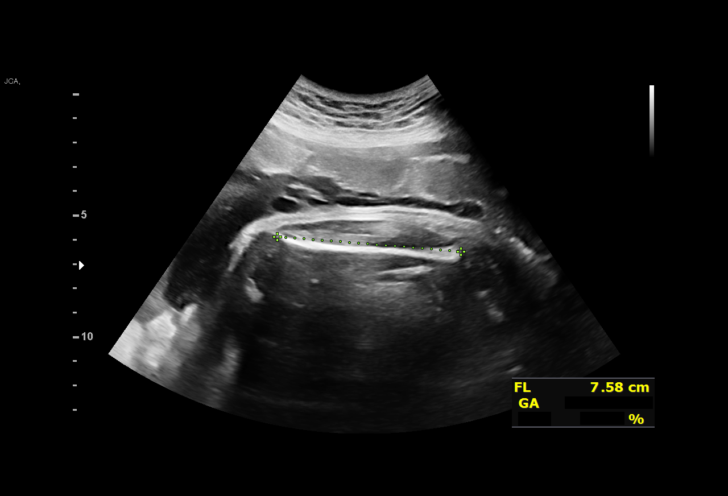
[im 8/23]
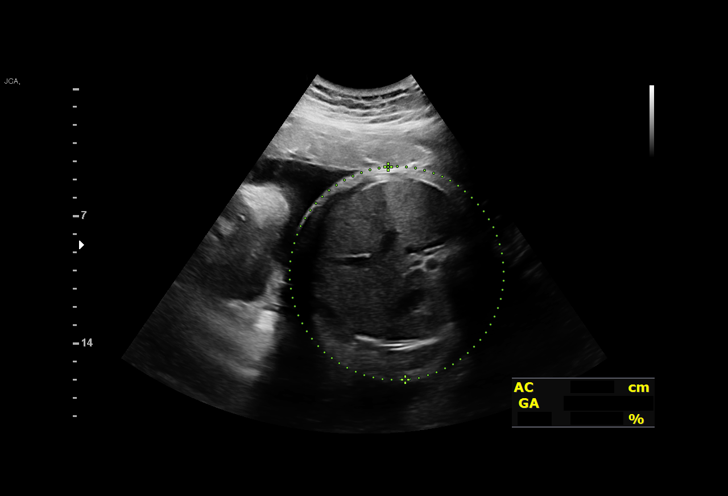
[im 10/23]
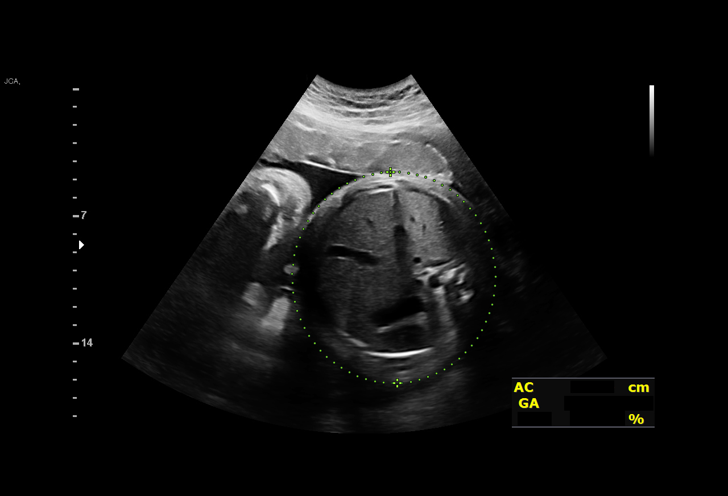
[im 11/23]
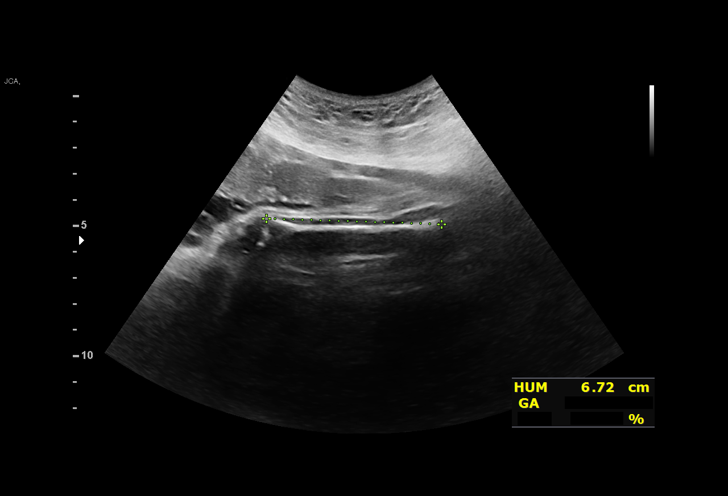
[im 13/23]
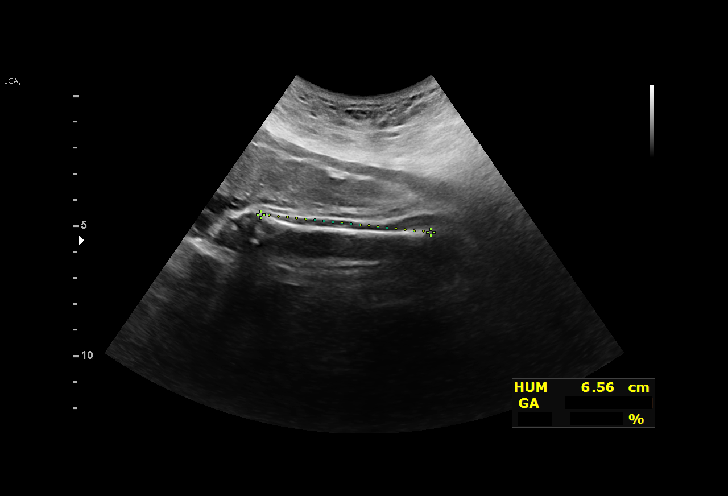
[im 14/23]
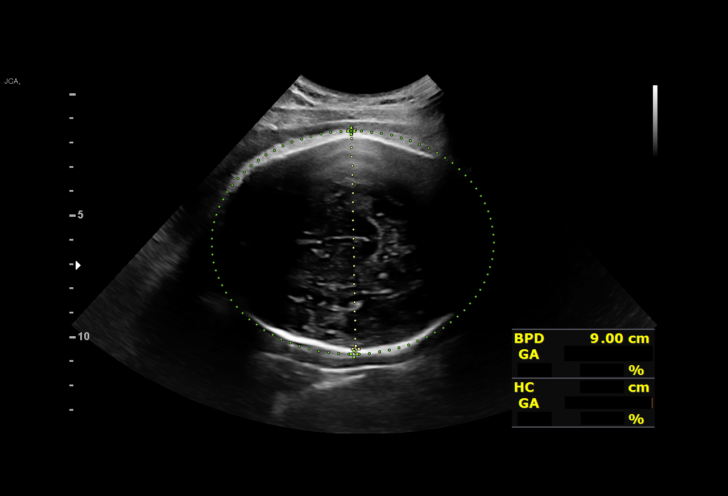
[im 16/23]
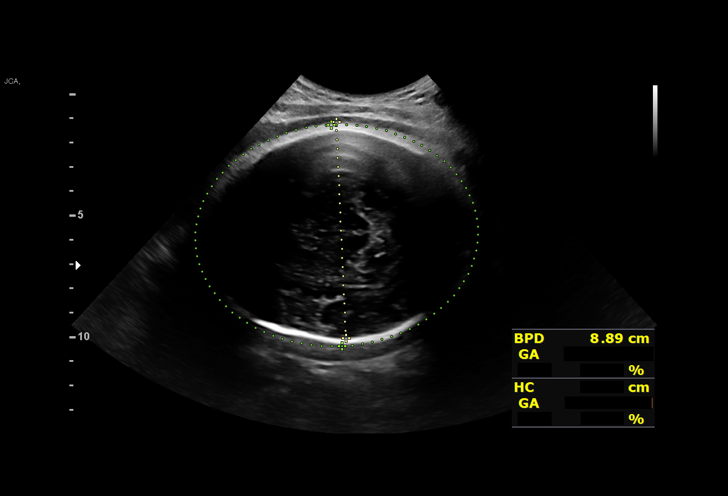
[im 18/23]
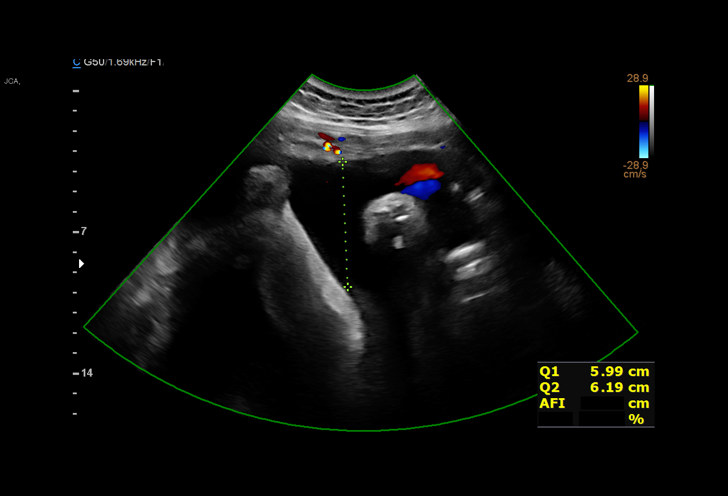
[im 19/23]
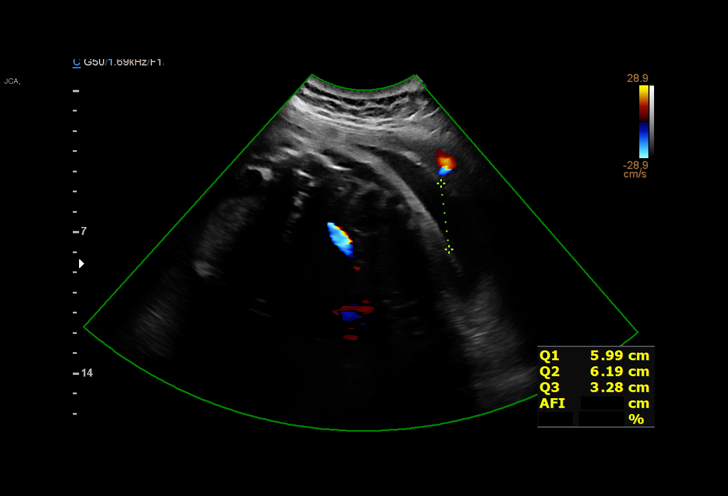
[im 21/23]
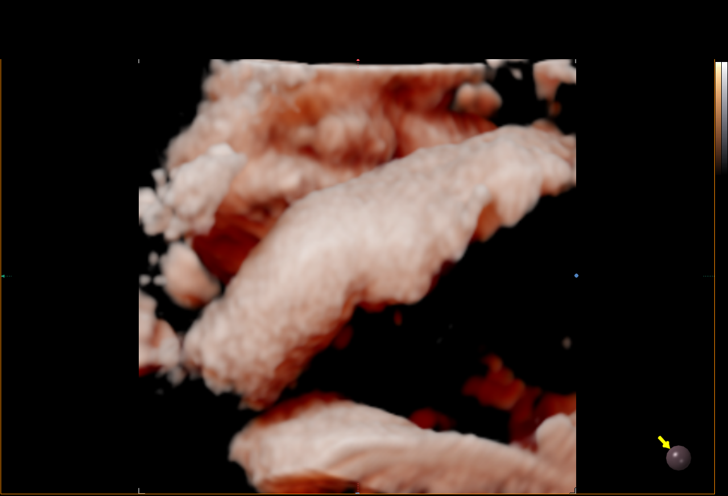
[im 23/23]
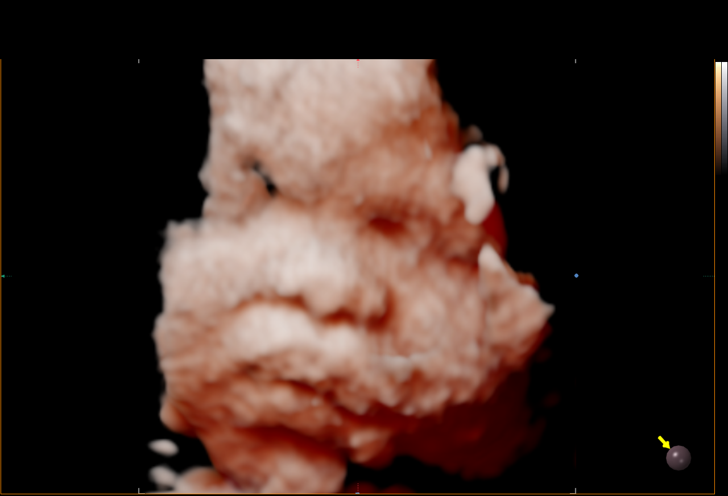

[14 of 23 positions shown; findings below may reference images not displayed]

Indications

 Fetal abnormality - other known or
 suspected (Absent Nasal Bone)
 Obesity complicating pregnancy, third
 trimester (BMI 33)
 Poor obstetric history: Previous preterm
 delivery, antepartum (35 wks)
 37 weeks gestation of pregnancy
 Encounter for other antenatal screening
 follow-up
 Low risk NIPS, neg AFP/Horizon
Fetal Evaluation

 Num Of Fetuses:         1
 Fetal Heart Rate(bpm):  144
 Cardiac Activity:       Observed
 Presentation:           Cephalic
 Placenta:               Anterior
 P. Cord Insertion:      Previously Visualized

 Amniotic Fluid
 AFI FV:      Within normal limits

 AFI Sum(cm)     %Tile       Largest Pocket(cm)
 15.46           58

 RUQ(cm)       RLQ(cm)       LUQ(cm)        LLQ(cm)
 5.99          0
Biometry

 BPD:      89.3  mm     G. Age:  36w 1d         35  %    CI:         74.3   %    70 - 86
                                                         FL/HC:      23.0   %    20.8 -
 HC:      328.9  mm     G. Age:  37w 3d         28  %    HC/AC:      0.91        0.92 -
 AC:      362.7  mm     G. Age:  40w 1d       > 99  %    FL/BPD:     84.5   %    71 - 87
 FL:       75.5  mm     G. Age:  38w 4d         81  %    FL/AC:      20.8   %    20 - 24
 HUM:      68.4  mm     G. Age:  39w 6d       > 95  %

 Est. FW:    5803  gm      8 lb 1 oz     92  %
OB History

 Gravidity:    3         Term:   0        Prem:   1        SAB:   1
 TOP:          0       Ectopic:  0        Living: 1
Gestational Age

 LMP:           38w 4d        Date:  01/20/20                 EDD:   10/26/20
 U/S Today:     38w 1d                                        EDD:   10/29/20
 Best:          37w 2d     Det. By:  U/S C R L  (04/04/20)    EDD:   11/04/20
Anatomy

 Cranium:               Appears normal         LVOT:                   Previously seen
 Cavum:                 Previously seen        Aortic Arch:            Previously seen
 Ventricles:            Appears normal         Ductal Arch:            Previously seen
 Choroid Plexus:        Previously seen        Diaphragm:              Previously seen
 Cerebellum:            Previously seen        Stomach:                Appears normal, left
                                                                       sided
 Posterior Fossa:       Previously seen        Abdomen:                Previously seen
 Nuchal Fold:           Previously seen        Abdominal Wall:         Previously seen
 Face:                  Absent nasal bone      Cord Vessels:           Previously seen
 Lips:                  Previously seen        Kidneys:                Appear normal
 Palate:                Previously seen        Bladder:                Appears normal
 Thoracic:              Previously seen        Spine:                  Previously seen
 Heart:                 Appears normal         Upper Extremities:      Previously seen
                        (4CH, axis, and
                        situs)
 RVOT:                  Previously seen        Lower Extremities:      Previously seen

 Other:  Heels/feet and open hands/5th digits previously visualized. Lenses
         and nasal bone previously visualized. VC, 3VV and 3VTV previously
         visualized. Male gender previously seen.
Impression

 Fetal growth is appropriate for gestational age.  The
 estimated fetal weight is at the 92nd percentile.  Amniotic
 fluid is normal and good fetal activity seen.  Cephalic
 presentation.
Recommendations

 No follow-up appointments were ma[REDACTED]
